# Patient Record
Sex: Female | Born: 1935
Health system: Southern US, Community
[De-identification: ages and names within clinical notes are randomized; demographics above are authoritative.]

## PROBLEM LIST (undated history)

## (undated) DIAGNOSIS — K219 Gastro-esophageal reflux disease without esophagitis: Secondary | ICD-10-CM

## (undated) DIAGNOSIS — R55 Syncope and collapse: Secondary | ICD-10-CM

## (undated) DIAGNOSIS — Z923 Personal history of irradiation: Secondary | ICD-10-CM

## (undated) DIAGNOSIS — E785 Hyperlipidemia, unspecified: Secondary | ICD-10-CM

## (undated) DIAGNOSIS — Z972 Presence of dental prosthetic device (complete) (partial): Secondary | ICD-10-CM

## (undated) DIAGNOSIS — K30 Functional dyspepsia: Secondary | ICD-10-CM

## (undated) DIAGNOSIS — E039 Hypothyroidism, unspecified: Secondary | ICD-10-CM

## (undated) DIAGNOSIS — G4730 Sleep apnea, unspecified: Secondary | ICD-10-CM

## (undated) DIAGNOSIS — Z973 Presence of spectacles and contact lenses: Secondary | ICD-10-CM

## (undated) DIAGNOSIS — C50919 Malignant neoplasm of unspecified site of unspecified female breast: Secondary | ICD-10-CM

## (undated) HISTORY — PX: DILATION AND CURETTAGE OF UTERUS: SHX78

## (undated) HISTORY — DX: Hyperlipidemia, unspecified: E78.5

## (undated) HISTORY — DX: Functional dyspepsia: K30

## (undated) HISTORY — DX: Gastro-esophageal reflux disease without esophagitis: K21.9

## (undated) HISTORY — PX: TUBAL LIGATION: SHX77

## (undated) HISTORY — DX: Sleep apnea, unspecified: G47.30

## (undated) HISTORY — PX: CARPAL TUNNEL RELEASE: SHX101

## (undated) HISTORY — DX: Malignant neoplasm of unspecified site of unspecified female breast: C50.919

## (undated) HISTORY — DX: Syncope and collapse: R55

## (undated) HISTORY — DX: Hypothyroidism, unspecified: E03.9

## (undated) HISTORY — PX: COLONOSCOPY: SHX174

---

## 1997-08-24 ENCOUNTER — Ambulatory Visit (HOSPITAL_COMMUNITY): Admission: RE | Admit: 1997-08-24 | Discharge: 1997-08-24 | Payer: Self-pay

## 1997-08-28 ENCOUNTER — Other Ambulatory Visit: Admission: RE | Admit: 1997-08-28 | Discharge: 1997-08-28 | Payer: Self-pay | Admitting: Cardiology

## 1998-09-03 ENCOUNTER — Other Ambulatory Visit: Admission: RE | Admit: 1998-09-03 | Discharge: 1998-09-03 | Payer: Self-pay | Admitting: Cardiology

## 1999-09-03 ENCOUNTER — Other Ambulatory Visit: Admission: RE | Admit: 1999-09-03 | Discharge: 1999-09-03 | Payer: Self-pay | Admitting: Internal Medicine

## 2004-01-28 DIAGNOSIS — R55 Syncope and collapse: Secondary | ICD-10-CM

## 2004-01-28 HISTORY — DX: Syncope and collapse: R55

## 2004-12-12 HISTORY — PX: US ECHOCARDIOGRAPHY: HXRAD669

## 2004-12-17 HISTORY — PX: CARDIOVASCULAR STRESS TEST: SHX262

## 2008-11-16 DIAGNOSIS — R7301 Impaired fasting glucose: Secondary | ICD-10-CM | POA: Insufficient documentation

## 2008-11-16 DIAGNOSIS — G2581 Restless legs syndrome: Secondary | ICD-10-CM | POA: Insufficient documentation

## 2008-11-16 DIAGNOSIS — E039 Hypothyroidism, unspecified: Secondary | ICD-10-CM | POA: Insufficient documentation

## 2008-11-16 DIAGNOSIS — F329 Major depressive disorder, single episode, unspecified: Secondary | ICD-10-CM | POA: Insufficient documentation

## 2008-11-16 DIAGNOSIS — K219 Gastro-esophageal reflux disease without esophagitis: Secondary | ICD-10-CM | POA: Insufficient documentation

## 2008-11-16 DIAGNOSIS — R350 Frequency of micturition: Secondary | ICD-10-CM | POA: Insufficient documentation

## 2008-12-11 DIAGNOSIS — G5600 Carpal tunnel syndrome, unspecified upper limb: Secondary | ICD-10-CM | POA: Insufficient documentation

## 2009-05-14 DIAGNOSIS — R55 Syncope and collapse: Secondary | ICD-10-CM | POA: Insufficient documentation

## 2009-11-20 DIAGNOSIS — Z2839 Other underimmunization status: Secondary | ICD-10-CM | POA: Insufficient documentation

## 2009-11-28 ENCOUNTER — Ambulatory Visit: Payer: Self-pay | Admitting: Cardiovascular Disease

## 2010-08-21 DIAGNOSIS — E162 Hypoglycemia, unspecified: Secondary | ICD-10-CM | POA: Insufficient documentation

## 2010-11-14 ENCOUNTER — Encounter: Payer: Self-pay | Admitting: Cardiovascular Disease

## 2010-11-21 ENCOUNTER — Ambulatory Visit (INDEPENDENT_AMBULATORY_CARE_PROVIDER_SITE_OTHER): Payer: Medicare Other | Admitting: Cardiovascular Disease

## 2010-11-21 ENCOUNTER — Encounter: Payer: Self-pay | Admitting: Cardiovascular Disease

## 2010-11-21 DIAGNOSIS — E7800 Pure hypercholesterolemia, unspecified: Secondary | ICD-10-CM | POA: Insufficient documentation

## 2010-11-21 DIAGNOSIS — R42 Dizziness and giddiness: Secondary | ICD-10-CM

## 2010-11-21 NOTE — Assessment & Plan Note (Signed)
She's not had her cholesterol levels drawn a while. She's not fasting today. She missed her recent appointment with Dr. Waynard Edwards.  I've encouraged her to get back on a good diet and exercise program. I'll see her again in 6 months. Should bring her lipid levels from Dr. Laurey Morale office.

## 2010-11-21 NOTE — Progress Notes (Signed)
[name] Date of Birth  1935/08/11 [name] [address]    [address] [place], [place]  [address] [phone]  Fax  [phone]  History of Present Illness:  75 yo with a hx of presyncope, hyperlipidemia, sleep apnea.  She has been under a lot of stress recently. Her sister was diagnosed with stage III inflammatory breast cancer. She has been going to [facility] a regular basis to help her sister with chemotherapy and other related issues.  She denies any chest pain, shortness breath, syncope, or presyncope.  She has a history of hypercholesterolemia. Her cholesterol levels are followed by Dr. Waynard Edwards.  She brought her blood pressure log with her today. All of her readings are acceptable.  Current Outpatient Prescriptions on File Prior to Visit  Medication Sig Dispense Refill  . aspirin 81 MG tablet Take 81 mg by mouth daily.        . Calcium Carbonate-Vitamin D (CALTRATE 600+D PO) Take by mouth daily.        . cholecalciferol (VITAMIN D) 400 UNITS TABS Take 400 Units by mouth daily.        . Coenzyme Q10 (COQ10) 100 MG CAPS Take by mouth daily.        Marland Kitchen doxepin (SINEQUAN) 50 MG capsule Take 50 mg by mouth.        . levothyroxine (SYNTHROID, LEVOTHROID) 125 MCG tablet Take 125 mcg by mouth daily.        . Magnesium 200 MG TABS Take 400 mg by mouth daily.       . Multiple Vitamin (MULTIVITAMIN) tablet Take 1 tablet by mouth daily.        Marland Kitchen omeprazole (PRILOSEC) 20 MG capsule Take 20 mg by mouth 4 (four) times a week.        . simvastatin (ZOCOR) 40 MG tablet Take 40 mg by mouth at bedtime.          Allergies  Allergen Reactions  . Shellfish-Derived Products     Past Medical History  Diagnosis Date  . Syncope and collapse   . Hypothyroidism   . Hyperlipidemia   . Sleep apnea   . GERD (gastroesophageal reflux disease)   . Indigestion     Past Surgical History  Procedure Date  . Tubal ligation   . Carpal tunnel release     LEFT HAND  . US  echocardiography 12/12/2004    EF 55-60%  . Cardiovascular stress test 12/17/2004    EF 73%. NO EVIDENCE OF ISCHEMIA    History  Smoking status  . Former Smoker  . Quit date: 01/27/1970  Smokeless tobacco  . Not on file    History  Alcohol Use No    Family History  Problem Relation Age of Onset  . Coronary artery disease Father   . Heart attack Father     Reviw of Systems:  Reviewed in the HPI.  All other systems are negative.  Physical Exam: BP 130/88  Pulse 80  Ht 5\' 3"  (1.6 m)  Wt 189 lb (85.73 kg)  BMI 33.48 kg/m2 The patient is alert and oriented x 3.  The mood and affect are normal.   Skin: warm and dry.  Color is normal.    HEENT:   Normal carotids, no JVD,   Lungs: clear   Heart: RR, no murmurs    Abdomen: + BS, nontender  Extremities:  No edema  Neuro:  nonfocal.     ECG: Normal sinus rhythm. She has  nonspecific ST and T wave changes.  There are no changes from her previous EKG. Assessment / Plan:

## 2010-11-21 NOTE — Assessment & Plan Note (Signed)
She's not had any recurrent episodes of presyncope.

## 2010-11-21 NOTE — Patient Instructions (Signed)
Your physician recommends that you continue on your current medications as directed. Please refer to the Current Medication list given to you today.  Your physician wants you to follow-up in: 6 months. You will receive a reminder letter in the mail two months in advance. If you don't receive a letter, please call our office to schedule the follow-up appointment.  

## 2011-01-30 ENCOUNTER — Encounter: Payer: Self-pay | Admitting: Cardiovascular Disease

## 2011-10-30 ENCOUNTER — Ambulatory Visit: Payer: Medicare Other | Admitting: Cardiovascular Disease

## 2011-12-02 ENCOUNTER — Ambulatory Visit: Payer: Medicare Other | Admitting: Cardiovascular Disease

## 2011-12-18 ENCOUNTER — Encounter: Payer: Self-pay | Admitting: Cardiovascular Disease

## 2011-12-18 ENCOUNTER — Ambulatory Visit (INDEPENDENT_AMBULATORY_CARE_PROVIDER_SITE_OTHER): Payer: Medicare Other | Admitting: Cardiovascular Disease

## 2011-12-18 VITALS — BP 146/86 | HR 82 | Ht 63.0 in | Wt 195.0 lb

## 2011-12-18 DIAGNOSIS — E7800 Pure hypercholesterolemia, unspecified: Secondary | ICD-10-CM

## 2011-12-18 MED ORDER — SIMVASTATIN 20 MG PO TABS: 20.0000 mg | ORAL_TABLET | Freq: Every day | ORAL | Status: DC

## 2011-12-18 NOTE — Progress Notes (Signed)
[name] Date of Birth  August 04, 1935 [facility] [address]    [address] [place], [place]  [address] [phone]  Fax  [phone]  History of Present Illness:  76 yo with a hx of presyncope, hyperlipidemia, sleep apnea.  She has been under a lot of stress recently. Her sister was diagnosed with stage III inflammatory breast cancer. She has been going to [facility] a regular basis to help her sister with chemotherapy and other related issues.  She denies any chest pain, shortness breath, syncope, or presyncope.  She has a history of hypercholesterolemia. Her cholesterol levels are followed by Dr. Waynard Edwards.  She brought her blood pressure log with her today. All of her readings are acceptable.  She brought labs were drawn at Dr. Mauricio Po his office. All of her numbers are quite good. Her a protein B. level is 67 which is quite good. Her total cholesterol is 150. The triglyceride level is 129. The HDL is 42. Her LDL is 82.  She's not had any episodes of chest pain or shortness breath.  Current Outpatient Prescriptions on File Prior to Visit  Medication Sig Dispense Refill  . aspirin 81 MG tablet Take 81 mg by mouth daily.        . Calcium Carbonate-Vitamin D (CALTRATE 600+D PO) Take by mouth daily.        . cholecalciferol (VITAMIN D) 400 UNITS TABS Take 400 Units by mouth daily.        Marland Kitchen doxepin (SINEQUAN) 50 MG capsule Take 50 mg by mouth.        . Magnesium 200 MG TABS Take 400 mg by mouth daily.       . Multiple Vitamin (MULTIVITAMIN) tablet Take 1 tablet by mouth daily.        Marland Kitchen omeprazole (PRILOSEC) 20 MG capsule Take 20 mg by mouth 4 (four) times a week.        . simvastatin (ZOCOR) 40 MG tablet Take 40 mg by mouth at bedtime.        . temazepam (RESTORIL) 15 MG capsule Take 15 mg by mouth at bedtime as needed.        . triamcinolone (NASACORT) 55 MCG/ACT nasal inhaler Place 2 sprays into the nose daily. As needed         Allergies  Allergen Reactions  .  Shellfish-Derived Products     Past Medical History  Diagnosis Date  . Syncope and collapse   . Hypothyroidism   . Hyperlipidemia   . Sleep apnea   . GERD (gastroesophageal reflux disease)   . Indigestion     Past Surgical History  Procedure Date  . Tubal ligation   . Carpal tunnel release     LEFT HAND  . US echocardiography 12/12/2004    EF 55-60%  . Cardiovascular stress test 12/17/2004    EF 73%. NO EVIDENCE OF ISCHEMIA    History  Smoking status  . Former Smoker  . Quit date: 01/27/1970  Smokeless tobacco  . Not on file    History  Alcohol Use No    Family History  Problem Relation Age of Onset  . Coronary artery disease Father   . Heart attack Father     Reviw of Systems:  Reviewed in the HPI.  All other systems are negative.  Physical Exam: BP 146/86  Pulse 82  Ht 5\' 3"  (1.6 m)  Wt 195 lb (88.451 kg)  BMI 34.54 kg/m2  SpO2 97% The patient is  alert and oriented x 3.  The mood and affect are normal.   Skin: warm and dry.  Color is normal.    HEENT:   Normal carotids, no JVD,   Lungs: clear   Heart: RR, no murmurs    Abdomen: + BS, nontender  Extremities:  No edema  Neuro:  nonfocal.     ECG: Nov. 21, 2013 - Normal sinus rhythm at 82 . She has nonspecific ST and T wave changes.  There are no changes from her previous EKG.  Assessment / Plan:

## 2011-12-18 NOTE — Patient Instructions (Addendum)
Your physician wants you to follow-up in: 1 year  You will receive a reminder letter in the mail two months in advance. If you don't receive a letter, please call our office to schedule the follow-up appointment.  Your physician recommends that you return for a FASTING lipid profile: 3 months need lipomed/ nmr with hfp  Your physician has recommended you make the following change in your medication:   Decrease simvastatin to 20 mg daily.

## 2011-12-18 NOTE — Assessment & Plan Note (Signed)
Sydney Wood is doing well. Her most recent cholesterol numbers are quite good. She would like to decrease her simvastatin dose to 20 mg a day. We'll followup the Lipomed  profile in 3 months as well as a hepatic profile.  I will see her again in 1 year for ov and fasting labs.

## 2012-02-09 ENCOUNTER — Encounter: Payer: Self-pay | Admitting: Cardiovascular Disease

## 2012-03-17 ENCOUNTER — Encounter: Payer: Self-pay | Admitting: Cardiovascular Disease

## 2012-03-17 ENCOUNTER — Other Ambulatory Visit (INDEPENDENT_AMBULATORY_CARE_PROVIDER_SITE_OTHER): Payer: Medicare Other

## 2012-03-17 DIAGNOSIS — R0989 Other specified symptoms and signs involving the circulatory and respiratory systems: Secondary | ICD-10-CM

## 2012-03-17 DIAGNOSIS — E7800 Pure hypercholesterolemia, unspecified: Secondary | ICD-10-CM

## 2012-03-22 ENCOUNTER — Encounter: Payer: Self-pay | Admitting: Cardiovascular Disease

## 2012-03-24 ENCOUNTER — Telehealth: Payer: Self-pay | Admitting: *Deleted

## 2012-03-24 DIAGNOSIS — E785 Hyperlipidemia, unspecified: Secondary | ICD-10-CM

## 2012-03-24 MED ORDER — SIMVASTATIN 40 MG PO TABS: 40.0000 mg | ORAL_TABLET | Freq: Every day | ORAL | Status: DC

## 2012-03-24 NOTE — Telephone Encounter (Signed)
Reviewed lipomed profile, per dr Elease Hashimoto pt to increase simvastatin to 40 mg and have lab draw in 6 months, pt agreed to plan and lab date was given.

## 2012-04-22 ENCOUNTER — Encounter: Payer: Self-pay | Admitting: Cardiovascular Disease

## 2012-04-29 DIAGNOSIS — Z91040 Latex allergy status: Secondary | ICD-10-CM | POA: Insufficient documentation

## 2012-05-10 ENCOUNTER — Emergency Department (HOSPITAL_COMMUNITY)
Admission: EM | Admit: 2012-05-10 | Discharge: 2012-05-10 | Disposition: A | Discharge status: Home / Self Care | Payer: Medicare Other | Attending: Emergency Medicine | Admitting: Emergency Medicine

## 2012-05-10 ENCOUNTER — Encounter (HOSPITAL_COMMUNITY): Payer: Self-pay | Admitting: Emergency Medicine

## 2012-05-10 DIAGNOSIS — K219 Gastro-esophageal reflux disease without esophagitis: Secondary | ICD-10-CM | POA: Insufficient documentation

## 2012-05-10 DIAGNOSIS — E039 Hypothyroidism, unspecified: Secondary | ICD-10-CM | POA: Insufficient documentation

## 2012-05-10 DIAGNOSIS — E785 Hyperlipidemia, unspecified: Secondary | ICD-10-CM | POA: Insufficient documentation

## 2012-05-10 DIAGNOSIS — Z79899 Other long term (current) drug therapy: Secondary | ICD-10-CM | POA: Insufficient documentation

## 2012-05-10 DIAGNOSIS — R040 Epistaxis: Secondary | ICD-10-CM | POA: Insufficient documentation

## 2012-05-10 DIAGNOSIS — Z87891 Personal history of nicotine dependence: Secondary | ICD-10-CM | POA: Insufficient documentation

## 2012-05-10 DIAGNOSIS — Z8719 Personal history of other diseases of the digestive system: Secondary | ICD-10-CM | POA: Insufficient documentation

## 2012-05-10 MED ORDER — OXYMETAZOLINE HCL 0.05 % NA SOLN
1.0000 | Freq: Once | NASAL | Status: AC
  Administered 2012-05-10: 1 via NASAL
  Filled 2012-05-10 (×2): qty 15

## 2012-05-10 NOTE — ED Notes (Signed)
Patient began having nosebleeds in February.  Patient was sent by urgent care because they couldn't get the bleeding to stop.

## 2012-05-10 NOTE — ED Provider Notes (Signed)
History    This chart was scribed for non-physician practitioner working with Sydney Wood. Ghim, MD by ED Scribe, Burman Nieves. This patient was seen in room WTR9/WTR9 and the patient's care was started at 7:24 PM.   CSN: [id number]  Arrival date & time 05/10/12  6213   First MD Initiated Contact with Patient 05/10/12 1924      Chief Complaint  Patient presents with  . Epistaxis    (Consider location/radiation/quality/duration/timing/severity/associated sxs/prior treatment) Patient is a 77 y.o. female presenting with nosebleeds. The history is provided by the patient. No language interpreter was used.  Epistaxis  This is a new problem. The current episode started 3 to 5 hours ago. The problem occurs constantly. The bleeding has been from the left nare.   Sydney Wood is a 77 y.o. female who presents to the Emergency Department complaining of moderate intermittent epistaxis onset 4 PM today and has been going on since February 2014. Pt was sent to ED from Urgent Care because they could not get the bleeding to stop. Urgent Care attempted to cauterize pt's left nare which did not stop the bleeding. Pt's nose has stopped bleeding as of now in the ED. Pt denies fever, chills, cough, nausea, vomiting, diarrhea, SOB, weakness, and any other associated symptoms. Pt denies taking any new blood thinner medications. Pt's current PCP is Dr. Waynard Edwards.   Past Medical History  Diagnosis Date  . Syncope and collapse   . Hypothyroidism   . Hyperlipidemia   . Sleep apnea   . GERD (gastroesophageal reflux disease)   . Indigestion     Past Surgical History  Procedure Laterality Date  . Tubal ligation    . Carpal tunnel release      LEFT HAND  . US echocardiography  12/12/2004    EF 55-60%  . Cardiovascular stress test  12/17/2004    EF 73%. NO EVIDENCE OF ISCHEMIA    Family History  Problem Relation Age of Onset  . Coronary artery disease Father   . Heart attack Father     History   Substance Use Topics  . Smoking status: Former Smoker    Quit date: 01/27/1970  . Smokeless tobacco: Not on file  . Alcohol Use: No    OB History   Grav Para Term Preterm Abortions TAB SAB Ect Mult Living                  Review of Systems  HENT: Positive for nosebleeds.   All other systems reviewed and are negative.    Allergies  Latex and Shellfish-derived products  Home Medications   Current Outpatient Rx  Name  Route  Sig  Dispense  Refill  . Calcium Carbonate-Vitamin D (CALTRATE 600+D PO)   Oral   Take by mouth daily.           . cholecalciferol (VITAMIN D) 400 UNITS TABS   Oral   Take 400 Units by mouth daily.           Marland Kitchen doxepin (SINEQUAN) 50 MG capsule   Oral   Take 50 mg by mouth daily.          . Levothyroxine Sodium (SYNTHROID PO)   Oral   Take 125 mcg by mouth daily.         . Magnesium 200 MG TABS   Oral   Take 400 mg by mouth daily.          . Multiple Vitamin (MULTIVITAMIN) tablet  Oral   Take 1 tablet by mouth daily.           Marland Kitchen omeprazole (PRILOSEC) 20 MG capsule   Oral   Take 20 mg by mouth daily.          . simvastatin (ZOCOR) 40 MG tablet   Oral   Take 1 tablet (40 mg total) by mouth at bedtime.   30 tablet   11     Increased dose     BP 145/97  Pulse 104  Temp(Src) 98.9 F (37.2 C) (Oral)  Resp 16  SpO2 92%  Physical Exam  Nursing note and vitals reviewed. Constitutional: She is oriented to person, place, and time. She appears well-developed and well-nourished. No distress.  HENT:  Head: Normocephalic and atraumatic.  Blood in left nostril. Area of nasal septal of the left nostril that was carterized by urgent care.  Eyes: EOM are normal.  Neck: Neck supple. No tracheal deviation present.  Cardiovascular: Normal rate.   Pulmonary/Chest: Effort normal. No respiratory distress.  Musculoskeletal: Normal range of motion.  Neurological: She is alert and oriented to person, place, and time.  Skin: Skin  is warm and dry.  Psychiatric: She has a normal mood and affect. Her behavior is normal.    ED Course  Procedures (including critical care time) DIAGNOSTIC STUDIES: Oxygen Saturation is 92% on room air, normal by my interpretation.    COORDINATION OF CARE: 7:36 PM Discussed ED treatment with pt and pt agrees.     Labs Reviewed - No data to display No results found.   1. Anterior epistaxis       MDM  Pt with nose bleed. Not anticoagulated. Uses c pap at night which dries up her nasal passages and has had multiple nose bleeds recently. Today unable to stop at home or at [facility]. Nasal left sided tried to be cauterized at [facility] but still bleeding. I have applied afrin 2 sprays into left nostril after evacuating all clots. Firm pressure for 5 min. Bleeding resolved.  Pt monitored in ED for about 30 min with no bleeding. Pt does not want packing, wants to try to go home with follow up with PCP in [place].    I personally performed the services described in this documentation, which was scribed in my presence. The recorded information has been reviewed and is accurate.         Lottie Mussel, PA-C 05/10/12 2354

## 2012-05-11 NOTE — ED Provider Notes (Signed)
Medical screening examination/treatment/procedure(s) were performed by non-physician practitioner and as supervising physician I was immediately available for consultation/collaboration.  Gavin Pound. Oletta Lamas, MD 05/11/12 [phone]

## 2012-08-16 ENCOUNTER — Encounter: Payer: Self-pay | Admitting: Gastroenterology

## 2012-08-20 ENCOUNTER — Encounter: Payer: Self-pay | Admitting: Gastroenterology

## 2012-09-22 ENCOUNTER — Other Ambulatory Visit: Payer: Self-pay | Admitting: Cardiovascular Disease

## 2012-09-22 ENCOUNTER — Other Ambulatory Visit (INDEPENDENT_AMBULATORY_CARE_PROVIDER_SITE_OTHER): Payer: Medicare Other

## 2012-09-22 DIAGNOSIS — E785 Hyperlipidemia, unspecified: Secondary | ICD-10-CM

## 2012-09-23 LAB — NMR LIPOPROFILE WITH LIPIDS
Cholesterol, Total: 132 mg/dL (ref <200)
HDL Particle Number: 35.7 umol/L (ref >=30.5)
HDL-C: 47 mg/dL (ref >=40)
LDL (calc): 65 mg/dL (ref <100)
LDL Size: 20.3 nm — ABNORMAL LOW (ref >20.5)
LP-IR Score: 70 — ABNORMAL HIGH (ref <=45)

## 2012-09-23 LAB — HEPATIC FUNCTION PANEL
Bilirubin, Direct: 0.1 mg/dL (ref 0.0–0.3)
Total Bilirubin: 0.5 mg/dL (ref 0.3–1.2)
Total Protein: 7.6 g/dL (ref 6.0–8.3)

## 2012-10-13 ENCOUNTER — Ambulatory Visit (AMBULATORY_SURGERY_CENTER): Payer: Self-pay | Admitting: *Deleted

## 2012-10-13 VITALS — Ht 64.0 in | Wt 195.0 lb

## 2012-10-13 DIAGNOSIS — Z1211 Encounter for screening for malignant neoplasm of colon: Secondary | ICD-10-CM

## 2012-10-13 MED ORDER — NA SULFATE-K SULFATE-MG SULF 17.5-3.13-1.6 GM/177ML PO SOLN
ORAL | Status: DC
Start: 2012-10-13 — End: 2012-10-27

## 2012-10-13 NOTE — Progress Notes (Signed)
Patient denies any allergies to eggs or soy. Patient denies any problems with anesthesia.  

## 2012-10-15 ENCOUNTER — Encounter: Payer: Self-pay | Admitting: Gastroenterology

## 2012-10-27 ENCOUNTER — Ambulatory Visit (AMBULATORY_SURGERY_CENTER): Payer: Medicare Other | Admitting: Gastroenterology

## 2012-10-27 ENCOUNTER — Encounter: Payer: Self-pay | Admitting: Gastroenterology

## 2012-10-27 VITALS — BP 132/70 | HR 60 | Temp 98.2°F | Resp 19 | Ht 64.0 in | Wt 195.0 lb

## 2012-10-27 DIAGNOSIS — Z1211 Encounter for screening for malignant neoplasm of colon: Secondary | ICD-10-CM

## 2012-10-27 DIAGNOSIS — K5730 Diverticulosis of large intestine without perforation or abscess without bleeding: Secondary | ICD-10-CM

## 2012-10-27 MED ORDER — SODIUM CHLORIDE 0.9 % IV SOLN: 500.0000 mL | INTRAVENOUS | Status: DC

## 2012-10-27 NOTE — Progress Notes (Signed)
Patient did not experience any of the following events: a burn prior to discharge; a fall within the facility; wrong site/side/patient/procedure/implant event; or a hospital transfer or hospital admission upon discharge from the facility. (G8907) Patient did not have preoperative order for IV antibiotic SSI prophylaxis. (G8918)  

## 2012-10-27 NOTE — Patient Instructions (Addendum)
YOU HAD AN ENDOSCOPIC PROCEDURE TODAY AT THE [facility]: Refer to the procedure report that was given to you for any specific questions about what was found during the examination.  If the procedure report does not answer your questions, please call your gastroenterologist to clarify.  If you requested that your care partner not be given the details of your procedure findings, then the procedure report has been included in a sealed envelope for you to review at your convenience later.  YOU SHOULD EXPECT: Some feelings of bloating in the abdomen. Passage of more gas than usual.  Walking can help get rid of the air that was put into your GI tract during the procedure and reduce the bloating. If you had a lower endoscopy (such as a colonoscopy or flexible sigmoidoscopy) you may notice spotting of blood in your stool or on the toilet paper. If you underwent a bowel prep for your procedure, then you may not have a normal bowel movement for a few days.  DIET: Your first meal following the procedure should be a light meal and then it is ok to progress to your normal diet.  A half-sandwich or bowl of soup is an example of a good first meal.  Heavy or fried foods are harder to digest and may make you feel nauseous or bloated.  Likewise meals heavy in dairy and vegetables can cause extra gas to form and this can also increase the bloating.  Drink plenty of fluids but you should avoid alcoholic beverages for 24 hours.  ACTIVITY: Your care partner should take you home directly after the procedure.  You should plan to take it easy, moving slowly for the rest of the day.  You can resume normal activity the day after the procedure however you should NOT DRIVE or use heavy machinery for 24 hours (because of the sedation medicines used during the test).    SYMPTOMS TO REPORT IMMEDIATELY: A gastroenterologist can be reached at any hour.  During normal business hours, 8:30 AM to 5:00 PM Monday through Friday,  call [phone].  After hours and on weekends, please call the GI answering service at [phone] who will take a message and have the physician on call contact you.   Following lower endoscopy (colonoscopy or flexible sigmoidoscopy):  Excessive amounts of blood in the stool  Significant tenderness or worsening of abdominal pains  Swelling of the abdomen that is new, acute  Fever of 100F or higher  FOLLOW UP: Our staff will call the home number listed on your records the next business day following your procedure to check on you and address any questions or concerns that you may have at that time regarding the information given to you following your procedure. This is a courtesy call and so if there is no answer at the home number and we have not heard from you through the emergency physician on call, we will assume that you have returned to your regular daily activities without incident.  SIGNATURES/CONFIDENTIALITY: You and/or your care partner have signed paperwork which will be entered into your electronic medical record.  These signatures attest to the fact that that the information above on your After Visit Summary has been reviewed and is understood.  Full responsibility of the confidentiality of this discharge information lies with you and/or your care-partner.  Please read over information about diverticulosis and high fiber diets  Continue your normal medications  No further need for a colonoscopy unless you need one

## 2012-10-27 NOTE — Progress Notes (Signed)
Lidocaine-40mg IV prior to Propofol InductionPropofol given over incremental dosages 

## 2012-10-27 NOTE — Op Note (Signed)
[facility] [address] [place], [address]   COLONOSCOPY PROCEDURE REPORT  PATIENT: Sydney Wood, Sydney Wood  MR#: [id number] BIRTHDATE: [birth year] , 77  yrs. old GENDER: Female ENDOSCOPIST: Louis Meckel, MD REFERRED XB:JYNW Perini, M.D. PROCEDURE DATE:  10/27/2012 PROCEDURE:   Colonoscopy, diagnostic First Screening Colonoscopy - Avg.  risk and is 50 yrs.  old or older - No.  Prior Negative Screening - Now for repeat screening. 10 or more years since last screening  History of Adenoma - Now for follow-up colonoscopy & has been > or = to 3 yrs.  N/A  Polyps Removed Today? No.  Recommend repeat exam, <10 yrs? No. ASA CLASS:   Class II INDICATIONS:Average risk patient for colon cancer. MEDICATIONS: propofol (Diprivan) 200mg  IV  DESCRIPTION OF PROCEDURE:   After the risks benefits and alternatives of the procedure were thoroughly explained, informed consent was obtained.  A digital rectal exam revealed no abnormalities of the rectum.   The LB [id number]  endoscope was introduced through the anus and advanced to the cecum, which was identified by both the appendix and ileocecal valve. No adverse events experienced.   The quality of the prep was Suprep good  The instrument was then slowly withdrawn as the colon was fully examined.      COLON FINDINGS: There was moderate diverticulosis noted in the sigmoid colon with associated muscular hypertrophy.   The colon mucosa was otherwise normal.  Retroflexed views revealed no abnormalities. The time to cecum=6 minutes 25 seconds.  Withdrawal time=10 minutes 30 seconds.  The scope was withdrawn and the procedure completed. COMPLICATIONS: There were no complications.  ENDOSCOPIC IMPRESSION: 1.   There was moderate diverticulosis noted in the sigmoid colon 2.   The colon mucosa was otherwise normal  RECOMMENDATIONS: Given your age, you will not need another colonoscopy for colon cancer screening or polyp  surveillance.  These types of tests usually stop around the age 16.   eSigned:  Louis Meckel, MD 10/27/2012 9:39 AM   cc:   PATIENT NAME:  Sydney Wood, Sydney Wood MR#: [id number]

## 2012-10-28 ENCOUNTER — Telehealth: Payer: Self-pay | Admitting: *Deleted

## 2012-10-28 NOTE — Telephone Encounter (Signed)
  Follow up Call-  Call back number 10/27/2012  Post procedure Call Back phone  # [phone]  Permission to leave phone message Yes     Patient questions:  Do you have a fever, pain , or abdominal swelling? no Pain Score  0 *  Have you tolerated food without any problems? yes  Have you been able to return to your normal activities? yes  Do you have any questions about your discharge instructions: Diet   no Medications  no Follow up visit  no  Do you have questions or concerns about your Care? no  Actions: * If pain score is 4 or above: No action needed, pain <4.

## 2012-12-02 ENCOUNTER — Encounter: Payer: Self-pay | Admitting: Cardiovascular Disease

## 2012-12-13 ENCOUNTER — Encounter: Payer: Self-pay | Admitting: Cardiovascular Disease

## 2012-12-13 ENCOUNTER — Ambulatory Visit (INDEPENDENT_AMBULATORY_CARE_PROVIDER_SITE_OTHER): Payer: Medicare Other | Admitting: Cardiovascular Disease

## 2012-12-13 VITALS — BP 146/94 | HR 87 | Ht 64.0 in | Wt 199.0 lb

## 2012-12-13 DIAGNOSIS — I10 Essential (primary) hypertension: Secondary | ICD-10-CM

## 2012-12-13 DIAGNOSIS — E7800 Pure hypercholesterolemia, unspecified: Secondary | ICD-10-CM

## 2012-12-13 NOTE — Patient Instructions (Signed)
Your physician wants you to follow-up in: 1 year  You will receive a reminder letter in the mail two months in advance. If you don't receive a letter, please call our office to schedule the follow-up appointment.   Your physician recommends that you return for a FASTING lipid profile: 1 year / NMR  Your physician recommends that you continue on your current medications as directed. Please refer to the Current Medication list given to you today.

## 2012-12-13 NOTE — Progress Notes (Signed)
Sydney Wood Date of Birth  [birth year] [facility] [address]    [address] [place], [place]  [address] [phone]  Fax  [phone]  Problem List 1. Hypertension 2. Hyperlipidemia 3/. Sleep apnea    History of Present Illness:  77 yo with a hx of presyncope, hyperlipidemia, sleep apnea.  She has been under a lot of stress recently. Her sister was diagnosed with stage III inflammatory breast cancer. She has been going to [facility] a regular basis to help her sister with chemotherapy and other related issues.  She denies any chest pain, shortness breath, syncope, or presyncope.  She has a history of hypercholesterolemia. Her cholesterol levels are followed by Dr. Waynard Edwards.  She brought her blood pressure log with her today. All of her readings are acceptable.  She brought labs were drawn at Dr. Mauricio Po his office. All of her numbers are quite good. Her a protein B. level is 67 which is quite good. Her total cholesterol is 150. The triglyceride level is 129. The HDL is 42. Her LDL is 82.  She's not had any episodes of chest pain or shortness breath.  Nov. 17, 2014: Feeling well.  BP at home is usually normal.  Still traveling lots - helping her sister with breast cancer.  Exercising a bit. tries to walk in the neighborhood.    Current Outpatient Prescriptions on File Prior to Visit  Medication Sig Dispense Refill  . aspirin 81 MG tablet Take 81 mg by mouth daily.      . B Complex-C (B COMPLEX-VITAMIN C PO) Take 1 packet by mouth daily.      . Calcium Carbonate-Vitamin D (CALTRATE 600+D PO) Take by mouth daily.        . cholecalciferol (VITAMIN D) 400 UNITS TABS Take 400 Units by mouth daily.        Marland Kitchen doxepin (SINEQUAN) 50 MG capsule Take 50 mg by mouth daily.       . Levothyroxine Sodium (SYNTHROID PO) Take 125 mcg by mouth daily.      . Magnesium 200 MG TABS Take 400 mg by mouth daily.       . Multiple Vitamin (MULTIVITAMIN) tablet Take 1 tablet by mouth daily.         Marland Kitchen omeprazole (PRILOSEC) 20 MG capsule Take 20 mg by mouth daily.       . simvastatin (ZOCOR) 40 MG tablet Take 1 tablet (40 mg total) by mouth at bedtime.  30 tablet  11  . temazepam (RESTORIL) 15 MG capsule Take 15 mg by mouth at bedtime as needed for sleep.       No current facility-administered medications on file prior to visit.    Allergies  Allergen Reactions  . Latex Hives and Swelling  . Shellfish-Derived Products Nausea And Vomiting    Past Medical History  Diagnosis Date  . Syncope and collapse   . Hypothyroidism   . Hyperlipidemia   . Sleep apnea     CPAP  . GERD (gastroesophageal reflux disease)   . Indigestion     Past Surgical History  Procedure Laterality Date  . Tubal ligation    . Carpal tunnel release      LEFT HAND  . US echocardiography  12/12/2004    EF 55-60%  . Cardiovascular stress test  12/17/2004    EF 73%. NO EVIDENCE OF ISCHEMIA    History  Smoking status  . Former Smoker  . Quit date: 01/27/1970  Smokeless tobacco  .  Never Used    History  Alcohol Use No    Family History  Problem Relation Age of Onset  . Coronary artery disease Father   . Heart attack Father   . Colon cancer Neg Hx   . Stomach cancer Neg Hx     Reviw of Systems:  Reviewed in the HPI.  All other systems are negative.  Physical Exam: BP 146/94  Pulse 87  Ht 5\' 4"  (1.626 m)  Wt 199 lb (90.266 kg)  BMI 34.14 kg/m2 The patient is alert and oriented x 3.  The mood and affect are normal.   Skin: warm and dry.  Color is normal.    HEENT:   Normal carotids, no JVD,   Lungs: clear   Heart: RR, no murmurs    Abdomen: + BS, nontender  Extremities:  No edema  Neuro:  nonfocal.     ECG: Nov. 17, 2014:  NSR at 66.  Normal ecg.   Assessment / Plan:

## 2012-12-13 NOTE — Assessment & Plan Note (Signed)
She is doing well.   Her LDL particle number is 1183 - down form 1300's .  Continue with current dose of simva.  I will see her again in 1 years.

## 2012-12-17 ENCOUNTER — Other Ambulatory Visit: Payer: Medicare Other

## 2012-12-20 ENCOUNTER — Ambulatory Visit: Payer: Medicare Other | Admitting: Cardiovascular Disease

## 2013-12-16 ENCOUNTER — Other Ambulatory Visit (INDEPENDENT_AMBULATORY_CARE_PROVIDER_SITE_OTHER): Payer: Medicare Other | Admitting: *Deleted

## 2013-12-16 DIAGNOSIS — E7800 Pure hypercholesterolemia, unspecified: Secondary | ICD-10-CM

## 2013-12-16 LAB — HEPATIC FUNCTION PANEL
ALBUMIN: 4.2 g/dL (ref 3.5–5.2)
ALT: 16 U/L (ref 0–35)
AST: 21 U/L (ref 0–37)
Alkaline Phosphatase: 84 U/L (ref 39–117)
Bilirubin, Direct: 0.1 mg/dL (ref 0.0–0.3)
Total Bilirubin: 0.6 mg/dL (ref 0.2–1.2)
Total Protein: 7.5 g/dL (ref 6.0–8.3)

## 2013-12-19 LAB — NMR LIPOPROFILE WITH LIPIDS
Cholesterol, Total: 156 mg/dL (ref 100–199)
HDL Particle Number: 31 umol/L (ref >=30.5)
HDL Size: 8.5 nm — ABNORMAL LOW (ref >=9.2)
HDL-C: 44 mg/dL (ref >39)
LARGE HDL: 2.2 umol/L — AB (ref >=4.8)
LDL CALC: 87 mg/dL (ref 0–99)
LDL Particle Number: 1269 nmol/L — ABNORMAL HIGH (ref <1000)
LDL SIZE: 20.6 nm (ref >=20.8)
LP-IR Score: 85 — ABNORMAL HIGH (ref <=45)
Large VLDL-P: 5.9 nmol/L — ABNORMAL HIGH (ref <=2.7)
SMALL LDL PARTICLE NUMBER: 545 nmol/L — AB (ref <=527)
Triglycerides: 123 mg/dL (ref 0–149)
VLDL Size: 57.8 nm — ABNORMAL HIGH (ref <=46.6)

## 2013-12-20 ENCOUNTER — Encounter: Payer: Self-pay | Admitting: Cardiovascular Disease

## 2013-12-20 ENCOUNTER — Ambulatory Visit (INDEPENDENT_AMBULATORY_CARE_PROVIDER_SITE_OTHER): Payer: Medicare Other | Admitting: Cardiovascular Disease

## 2013-12-20 VITALS — BP 150/86 | HR 89 | Ht 64.0 in | Wt 194.4 lb

## 2013-12-20 DIAGNOSIS — E785 Hyperlipidemia, unspecified: Secondary | ICD-10-CM

## 2013-12-20 DIAGNOSIS — E7800 Pure hypercholesterolemia, unspecified: Secondary | ICD-10-CM

## 2013-12-20 DIAGNOSIS — I10 Essential (primary) hypertension: Secondary | ICD-10-CM

## 2013-12-20 NOTE — Patient Instructions (Signed)
Your physician recommends that you continue on your current medications as directed. Please refer to the Current Medication list given to you today.  Your physician wants you to follow-up in: 1 year with Dr. Acie Fredrickson.  You will receive a reminder letter in the mail two months in advance. If you don't receive a letter, please call our office to schedule the follow-up appointment.  Your physician recommends that you return for lab work (lipomed profile, liver panel, bmet) in: 1 year on the day of or a few days before your office visit with Dr. Acie Fredrickson.  You will need to FAST for this appointment - nothing to eat or drink after midnight the night before except water.

## 2013-12-20 NOTE — Progress Notes (Signed)
Sydney Wood Date of Birth  1935-08-18 [facility] [address]    [address] [place], [place]  [address] [phone]  Fax  [phone]  Problem List 1. Hypertension 2. Hyperlipidemia 3/. Sleep apnea    History of Present Illness:  78 yo with a hx of presyncope, hyperlipidemia, sleep apnea.  She has been under a lot of stress recently. Her sister was diagnosed with stage III inflammatory breast cancer. She has been going to [facility] a regular basis to help her sister with chemotherapy and other related issues.  She denies any chest pain, shortness breath, syncope, or presyncope.  She has a history of hypercholesterolemia. Her cholesterol levels are followed by Dr. Joylene Draft.  She brought her blood pressure log with her today. All of her readings are acceptable.  She brought labs were drawn at Dr. Lindell Noe his office. All of her numbers are quite good. Her a protein B. level is 67 which is quite good. Her total cholesterol is 150. The triglyceride level is 129. The HDL is 42. Her LDL is 82.  She's not had any episodes of chest pain or shortness breath.  Nov. 17, 2014: Feeling well.  BP at home is usually normal.  Still traveling lots - helping her sister with breast cancer.  Exercising a bit. tries to walk in the neighborhood.   Nov. 24, 2015:  Sydney Wood is doing well.  Tries to exercise.  BP readings are ok No CP or dyspnea.    Current Outpatient Prescriptions on File Prior to Visit  Medication Sig Dispense Refill  . aspirin 81 MG tablet Take 81 mg by mouth daily.    . B Complex-C (B COMPLEX-VITAMIN C PO) Take 1 packet by mouth daily.    . Calcium Carbonate-Vitamin D (CALTRATE 600+D PO) Take by mouth daily.      . cholecalciferol (VITAMIN D) 400 UNITS TABS Take 400 Units by mouth daily.      . Levothyroxine Sodium (SYNTHROID PO) Take 150 mcg by mouth daily.     . Magnesium 200 MG TABS Take 400 mg by mouth daily.     . Multiple Vitamin (MULTIVITAMIN) tablet Take 1  tablet by mouth daily.      Marland Kitchen omeprazole (PRILOSEC) 20 MG capsule Take 20 mg by mouth daily.     . temazepam (RESTORIL) 15 MG capsule Take 15 mg by mouth at bedtime as needed for sleep.     No current facility-administered medications on file prior to visit.    Allergies  Allergen Reactions  . Latex Hives and Swelling  . Shellfish-Derived Products Nausea And Vomiting    Past Medical History  Diagnosis Date  . Syncope and collapse   . Hypothyroidism   . Hyperlipidemia   . Sleep apnea     CPAP  . GERD (gastroesophageal reflux disease)   . Indigestion     Past Surgical History  Procedure Laterality Date  . Tubal ligation    . Carpal tunnel release      LEFT HAND  . US echocardiography  12/12/2004    EF 55-60%  . Cardiovascular stress test  12/17/2004    EF 73%. NO EVIDENCE OF ISCHEMIA    History  Smoking status  . Former Smoker  . Quit date: 01/27/1970  Smokeless tobacco  . Never Used    History  Alcohol Use No    Family History  Problem Relation Age of Onset  . Coronary artery disease Father   . Heart attack Father   .  Colon cancer Neg Hx   . Stomach cancer Neg Hx     Reviw of Systems:  Reviewed in the HPI.  All other systems are negative.  Physical Exam: BP 150/86 mmHg  Pulse 89  Ht 5\' 4"  (1.626 m)  Wt 194 lb 6.4 oz (88.179 kg)  BMI 33.35 kg/m2 The patient is alert and oriented x 3.  The mood and affect are normal.   Skin: warm and dry.  Color is normal.    HEENT:   Normal carotids, no JVD,   Lungs: clear   Heart: RR, no murmurs    Abdomen: + BS, nontender  Extremities:  No edema  Neuro:  nonfocal.     ECG: Nov. 24, 2015:, NSR at 71.  No ST or T wave changes  Assessment / Plan:

## 2013-12-20 NOTE — Assessment & Plan Note (Signed)
Her blood pressure is a bit high in the office. She notes that it is always a little higher in the doctor's office. She'll continue to measure her blood pressure on a regular basis. She'll call if it stays elevated.

## 2013-12-20 NOTE — Assessment & Plan Note (Addendum)
She continues to have very mild hyperlipidemia. Her LDL particle number is 1269. She is on low-dose atorvastatin.  Our goal is < 1000.   She may tolerate a higher dose. She will discuss this later with Dr. Joylene Draft.  She did not tolerate Simvastatin but seems to be tolerating the Atorvastatin fairly well. Continue diet and exercise.

## 2014-04-07 ENCOUNTER — Telehealth: Payer: Self-pay | Admitting: *Deleted

## 2014-04-07 DIAGNOSIS — C50411 Malignant neoplasm of upper-outer quadrant of right female breast: Secondary | ICD-10-CM

## 2014-04-07 NOTE — Telephone Encounter (Signed)
Confirmed [facility] for 04/19/14 at 12N .  Instructions and contact information given.

## 2014-04-19 ENCOUNTER — Other Ambulatory Visit (HOSPITAL_BASED_OUTPATIENT_CLINIC_OR_DEPARTMENT_OTHER): Payer: Medicare Other

## 2014-04-19 ENCOUNTER — Other Ambulatory Visit: Payer: Self-pay | Admitting: General Surgery

## 2014-04-19 ENCOUNTER — Encounter (INDEPENDENT_AMBULATORY_CARE_PROVIDER_SITE_OTHER): Payer: Self-pay

## 2014-04-19 ENCOUNTER — Ambulatory Visit: Payer: Medicare Other | Admitting: Physical Therapy

## 2014-04-19 ENCOUNTER — Ambulatory Visit (HOSPITAL_BASED_OUTPATIENT_CLINIC_OR_DEPARTMENT_OTHER): Payer: Medicare Other

## 2014-04-19 ENCOUNTER — Encounter: Payer: Self-pay | Admitting: General Practice

## 2014-04-19 ENCOUNTER — Ambulatory Visit
Admission: RE | Admit: 2014-04-19 | Discharge: 2014-04-19 | Disposition: A | Discharge status: Home / Self Care | Payer: Medicare Other | Source: Ambulatory Visit | Attending: Radiation Oncology | Admitting: Radiation Oncology

## 2014-04-19 ENCOUNTER — Encounter: Payer: Self-pay | Admitting: Hematology and Oncology

## 2014-04-19 ENCOUNTER — Ambulatory Visit (HOSPITAL_BASED_OUTPATIENT_CLINIC_OR_DEPARTMENT_OTHER): Payer: Medicare Other | Admitting: Hematology and Oncology

## 2014-04-19 VITALS — BP 160/76 | HR 79 | Temp 97.3°F | Resp 18 | Ht 64.0 in | Wt 193.8 lb

## 2014-04-19 DIAGNOSIS — Z171 Estrogen receptor negative status [ER-]: Secondary | ICD-10-CM | POA: Diagnosis not present

## 2014-04-19 DIAGNOSIS — C50411 Malignant neoplasm of upper-outer quadrant of right female breast: Secondary | ICD-10-CM

## 2014-04-19 DIAGNOSIS — D0511 Intraductal carcinoma in situ of right breast: Secondary | ICD-10-CM

## 2014-04-19 DIAGNOSIS — D0510 Intraductal carcinoma in situ of unspecified breast: Secondary | ICD-10-CM | POA: Insufficient documentation

## 2014-04-19 LAB — COMPREHENSIVE METABOLIC PANEL (CC13)
ALBUMIN: 4.1 g/dL (ref 3.5–5.0)
ALK PHOS: 96 U/L (ref 40–150)
ALT: 17 U/L (ref 0–55)
ANION GAP: 10 meq/L (ref 3–11)
AST: 19 U/L (ref 5–34)
BUN: 15.2 mg/dL (ref 7.0–26.0)
CO2: 25 mEq/L (ref 22–29)
CREATININE: 0.8 mg/dL (ref 0.6–1.1)
Calcium: 9.5 mg/dL (ref 8.4–10.4)
Chloride: 105 mEq/L (ref 98–109)
EGFR: 67 mL/min/{1.73_m2} — ABNORMAL LOW (ref >90)
Glucose: 95 mg/dl (ref 70–140)
Potassium: 4.6 mEq/L (ref 3.5–5.1)
Sodium: 140 mEq/L (ref 136–145)
Total Bilirubin: 0.44 mg/dL (ref 0.20–1.20)
Total Protein: 7.5 g/dL (ref 6.4–8.3)

## 2014-04-19 LAB — CBC WITH DIFFERENTIAL/PLATELET
BASO%: 0.2 % (ref 0.0–2.0)
Basophils Absolute: 0 10*3/uL (ref 0.0–0.1)
EOS ABS: 0.1 10*3/uL (ref 0.0–0.5)
EOS%: 1.3 % (ref 0.0–7.0)
HEMATOCRIT: 39.5 % (ref 34.8–46.6)
HGB: 12.8 g/dL (ref 11.6–15.9)
LYMPH%: 18.9 % (ref 14.0–49.7)
MCH: 28.4 pg (ref 25.1–34.0)
MCHC: 32.4 g/dL (ref 31.5–36.0)
MCV: 87.6 fL (ref 79.5–101.0)
MONO#: 0.5 10*3/uL (ref 0.1–0.9)
MONO%: 8.5 % (ref 0.0–14.0)
NEUT%: 71.1 % (ref 38.4–76.8)
NEUTROS ABS: 4.1 10*3/uL (ref 1.5–6.5)
PLATELETS: 198 10*3/uL (ref 145–400)
RBC: 4.51 10*6/uL (ref 3.70–5.45)
RDW: 13.8 % (ref 11.2–14.5)
WBC: 5.8 10*3/uL (ref 3.9–10.3)
lymph#: 1.1 10*3/uL (ref 0.9–3.3)

## 2014-04-19 NOTE — Progress Notes (Signed)
Sydney Wood is a very pleasant 79 y.o. female from [place], [place] with newly diagnosed DCIS of the right breast.  Biopsy results revealed the tumor's hormone status as ER negative and PR negative.   She presents today with her husband to the [facility]) for treatment consideration and recommendations from the breast surgeon, radiation oncologist, and medical oncologist.     I briefly met with Sydney Wood and her husband during her [facility] visit today. We discussed the purpose of the Survivorship Clinic, which will include monitoring for recurrence, coordinating completion of age and gender-appropriate cancer screenings, promotion of overall wellness, as well as managing potential late/long-term side effects of anti-cancer treatments.    The treatment plan for Sydney Wood will likely include surgery and radiation therapy.  As of today, the intent of treatment for Sydney Wood is cure, therefore she will be eligible for the Survivorship Clinic upon her completion of treatment.  Her survivorship care plan (SCP) document will be drafted and updated throughout the course of her treatment trajectory. She will receive the SCP in an office visit with myself in the Survivorship Clinic once she has completed treatment.   Sydney Wood was encouraged to ask questions and all questions were answered to her satisfaction.  She was given my business card and encouraged to contact me with any concerns regarding survivorship.  I look forward to participating in her care.   Mike Craze, NP [facility] [phone]

## 2014-04-19 NOTE — Progress Notes (Signed)
[facility] Psychosocial Distress Screening Spiritual Care  Counseling Intern [facility] visited with Ms Brunsman and her husband at [facility] to introduce [facility] team/resoureces, and to review distress screen per protocol.  The patient scored a 1 on the Psychosocial Distress Thermometer which indicates mild distress. Counseling Interna also assessed for distress and other psychosocial needs.   ONCBCN DISTRESS SCREENING 04/19/2014  Screening Type Initial Screening  Distress experienced in past week (1-10) 1  Emotional problem type Adjusting to illness  Physical Problem type Tingling hands/feet;Skin dry/itchy  Referral to support programs Yes   Pt reports good support and notes that physical problems listed above are pre-existing conditions.  She provided [phone] and [email or web address] as preferred means to contact her.   Follow up needed: No.  Patient has full packet of [facility] [facility] print materials, including calendar, flyer, staff list, and contact info.  [place], [place]

## 2014-04-19 NOTE — Progress Notes (Signed)
Checked in new pt with no financial concerns at this time.  Informed pt if chemo is part of her treatment I will call her ins to see if Josem Kaufmann is req and will obtain it if it is as well as contact foundations that offer copay assistance for chemo if needed.  She has my card for any billing questions or concerns.

## 2014-04-19 NOTE — Progress Notes (Signed)
Patient Care Team: Crist Infante, MD as PCP - General (Internal Medicine) Rolm Bookbinder, MD as Consulting Physician (General Surgery) Nicholas Lose, MD as Consulting Physician (Hematology and Oncology) Kyung Rudd, MD as Consulting Physician (Radiation Oncology) Rockwell Germany, RN as Registered Nurse Mauro Kaufmann, RN as Registered Nurse Holley Bouche, NP as Nurse Practitioner (Nurse Practitioner)  DIAGNOSIS: Breast cancer of upper-outer quadrant of right female breast   Staging form: Breast, AJCC 7th Edition     Clinical stage from 04/19/2014: Stage 0 (Tis (DCIS), N0, M0) - Unsigned       Staging comments: Staged at breast conference on 04/19/14  CHIEF COMPLIANT: Newly diagnosed DCIS right breast  INTERVAL HISTORY: Sydney Wood is a 79 year old lady with above-mentioned history of right breast DCIS. She had a routine screening mammogram which revealed right breast calcifications that are 1 cm in size. She underwent ultrasound and a biopsy which revealed high-grade DCIS with comedonecrosis that was ER/PR negative. She was presented this morning to the multidisciplinary tumor board and she is here today at the [facility] clinic to discuss a treatment plan. Patient is asymptomatic with regards to this breast tumor.  REVIEW OF SYSTEMS:   Constitutional: Denies fevers, chills or abnormal weight loss Eyes: Denies blurriness of vision Ears, nose, mouth, throat, and face: Denies mucositis or sore throat Respiratory: Denies cough, dyspnea or wheezes Cardiovascular: Denies palpitation, chest discomfort or lower extremity swelling Gastrointestinal:  Denies nausea, heartburn or change in bowel habits Skin: Denies abnormal skin rashes Lymphatics: Denies new lymphadenopathy or easy bruising Neurological:Denies numbness, tingling or new weaknesses Behavioral/Psych: Mood is stable, no new changes  Breast: Right breast nodule related to recent biopsy All other systems were reviewed with the patient  and are negative.  I have reviewed the past medical history, past surgical history, social history and family history with the patient and they are unchanged from previous note.  ALLERGIES:  is allergic to latex; shellfish-derived products; and simvastatin-high dose.  MEDICATIONS:  Current Outpatient Prescriptions  Medication Sig Dispense Refill  . aspirin 81 MG tablet Take 81 mg by mouth daily.    Marland Kitchen atorvastatin (LIPITOR) 10 MG tablet Take 10 mg by mouth daily.  1  . Calcium Carbonate-Vitamin D (CALTRATE 600+D PO) Take by mouth daily.      . cholecalciferol (VITAMIN D) 400 UNITS TABS Take 400 Units by mouth daily.      Marland Kitchen doxepin (SINEQUAN) 25 MG capsule Take 25 mg by mouth daily.  0  . ibuprofen (ADVIL,MOTRIN) 100 MG tablet Take 100 mg by mouth as needed for fever.    . Levothyroxine Sodium (SYNTHROID PO) Take 150 mcg by mouth daily.     . Magnesium 200 MG TABS Take 400 mg by mouth daily.     . Multiple Vitamin (MULTIVITAMIN) tablet Take 1 tablet by mouth daily.      Marland Kitchen omeprazole (PRILOSEC) 20 MG capsule Take 20 mg by mouth daily.     . temazepam (RESTORIL) 15 MG capsule Take 15 mg by mouth at bedtime as needed for sleep.    . vitamin B-12 (CYANOCOBALAMIN) 1000 MCG tablet Take 1,000 mcg by mouth daily.     No current facility-administered medications for this visit.    PHYSICAL EXAMINATION: ECOG PERFORMANCE STATUS: 0 - Asymptomatic  Filed Vitals:   04/19/14 1213  BP: 160/76  Pulse: 79  Temp: 97.3 F (36.3 C)  Resp: 18   Filed Weights   04/19/14 1213  Weight: 193 lb 12.8 oz (  87.907 kg)    GENERAL:alert, no distress and comfortable SKIN: skin color, texture, turgor are normal, no rashes or significant lesions EYES: normal, Conjunctiva are pink and non-injected, sclera clear OROPHARYNX:no exudate, no erythema and lips, buccal mucosa, and tongue normal  NECK: supple, thyroid normal size, non-tender, without nodularity LYMPH:  no palpable lymphadenopathy in the cervical,  axillary or inguinal LUNGS: clear to auscultation and percussion with normal breathing effort HEART: regular rate & rhythm and no murmurs and no lower extremity edema ABDOMEN:abdomen soft, non-tender and normal bowel sounds Musculoskeletal:no cyanosis of digits and no clubbing  NEURO: alert & oriented x 3 with fluent speech, no focal motor/sensory deficits BREAST: Right breast hematoma from recent biopsy. No palpable axillary supraclavicular or infraclavicular adenopathy no breast tenderness or nipple discharge. (exam performed in the presence of a chaperone)  LABORATORY DATA:  I have reviewed the data as listed   Chemistry      Component Value Date/Time   NA 140 04/19/2014 1204   K 4.6 04/19/2014 1204   CO2 25 04/19/2014 1204   BUN 15.2 04/19/2014 1204   CREATININE 0.8 04/19/2014 1204      Component Value Date/Time   CALCIUM 9.5 04/19/2014 1204   ALKPHOS 96 04/19/2014 1204   ALKPHOS 84 12/16/2013 0830   AST 19 04/19/2014 1204   AST 21 12/16/2013 0830   ALT 17 04/19/2014 1204   ALT 16 12/16/2013 0830   BILITOT 0.44 04/19/2014 1204   BILITOT 0.6 12/16/2013 0830       Lab Results  Component Value Date   WBC 5.8 04/19/2014   HGB 12.8 04/19/2014   HCT 39.5 04/19/2014   MCV 87.6 04/19/2014   PLT 198 04/19/2014   NEUTROABS 4.1 04/19/2014   ASSESSMENT & PLAN:  Breast cancer of upper-outer quadrant of right female breast Right breast high-grade DCIS with calcifications and comedonecrosis ER PR Negative;1 cm group of calcification detected on the screening mammogram  Pathology and radiology counseling: I discussed with the patient the details of pathology and radiology. We discussed the difference between in situ cancer and invasive breast cancer. I also discussed the significance of calcifications in comedonecrosis and the risk of DCIS being high-grade. We also discussed the estrogen and progesterone receptors and their negativity in terms of complications to  treatment.  Recommendation based on multidisciplinary tumor board: 1. Breast conserving surgery followed by 2. Radiation therapy  There is no role of antiestrogen therapy because she is ER/PR negative. Patient will follow with radiation and surgery for her surveillance and follow-ups. Return to clinic as needed.    No orders of the defined types were placed in this encounter.   The patient has a good understanding of the overall plan. she agrees with it. She will call with any problems that may develop before her next visit here.   Rulon Eisenmenger, MD

## 2014-04-19 NOTE — Assessment & Plan Note (Signed)
Right breast high-grade DCIS with calcifications and comedonecrosis ER PR Negative;1 cm group of calcification detected on the screening mammogram  Pathology and radiology counseling: I discussed with the patient the details of pathology and radiology. We discussed the difference between in situ cancer and invasive breast cancer. I also discussed the significance of calcifications in comedonecrosis and the risk of DCIS being high-grade. We also discussed the estrogen and progesterone receptors and their negativity in terms of complications to treatment.  Recommendation based on multidisciplinary tumor board: 1. Breast conserving surgery followed by 2. Radiation therapy  There is no role of antiestrogen therapy because she is ER/PR negative. Patient will follow with radiation and surgery for her surveillance and follow-ups. Return to clinic as needed.

## 2014-04-23 ENCOUNTER — Encounter: Payer: Self-pay | Admitting: Radiation Oncology

## 2014-04-23 NOTE — Progress Notes (Signed)
Radiation Oncology         [phone] ________________________________  Name: Sydney Wood MRN: [id number]  Date: 04/19/2014  DOB: November 26, 1935  KG:[id number] A, MD  Fanny Skates, MD     REFERRING PHYSICIAN: Fanny Skates, MD   DIAGNOSIS: The encounter diagnosis was Breast cancer of upper-outer quadrant of right female breast.  Breast cancer of upper-outer quadrant of right female breast   Staging form: Breast, AJCC 7th Edition     Clinical stage from 04/19/2014: Stage 0 (Tis (DCIS), N0, M0) - Unsigned       Staging comments: Staged at breast conference on 04/19/14     HISTORY OF PRESENT ILLNESS::Sydney Wood is a 79 y.o. female who is seen for an initial consultation visit regarding the patient's diagnosis of DCIS of the right breast.  The patient presented was originally with suspicious calcifications on screening mammogram. A diagnostic mammogram confirmed this finding and a stereotactic biopsy of this area was performed. This returned positive for DCIS. This represented a grade 3 ductal carcinoma in situ Receptors studies have been performed and the tumor is estrogen receptor negative and progesterone receptor negative.  The patient has not undergone an MRI scan of the breasts.    PREVIOUS RADIATION THERAPY: No   PAST MEDICAL HISTORY:  has a past medical history of Syncope and collapse; Hypothyroidism; Hyperlipidemia; Sleep apnea; GERD (gastroesophageal reflux disease); Indigestion; and Breast cancer.     PAST SURGICAL HISTORY: Past Surgical History  Procedure Laterality Date  . Tubal ligation    . Carpal tunnel release      LEFT HAND  . US echocardiography  12/12/2004    EF 55-60%  . Cardiovascular stress test  12/17/2004    EF 73%. NO EVIDENCE OF ISCHEMIA     FAMILY HISTORY: family history includes Breast cancer in her paternal grandmother and sister; Coronary artery disease in her father; Heart attack in her father; Lymphoma in her brother. There  is no history of Colon cancer or Stomach cancer.   SOCIAL HISTORY:  reports that she quit smoking about 44 years ago. She has never used smokeless tobacco. She reports that she does not drink alcohol or use illicit drugs.   ALLERGIES: Latex; Shellfish-derived products; and Simvastatin-high dose   MEDICATIONS:  Current Outpatient Prescriptions  Medication Sig Dispense Refill  . aspirin 81 MG tablet Take 81 mg by mouth daily.    Marland Kitchen atorvastatin (LIPITOR) 10 MG tablet Take 10 mg by mouth daily.  1  . Calcium Carbonate-Vitamin D (CALTRATE 600+D PO) Take by mouth daily.      . cholecalciferol (VITAMIN D) 400 UNITS TABS Take 400 Units by mouth daily.      Marland Kitchen doxepin (SINEQUAN) 25 MG capsule Take 25 mg by mouth daily.  0  . ibuprofen (ADVIL,MOTRIN) 100 MG tablet Take 100 mg by mouth as needed for fever.    . Levothyroxine Sodium (SYNTHROID PO) Take 150 mcg by mouth daily.     . Magnesium 200 MG TABS Take 400 mg by mouth daily.     . Multiple Vitamin (MULTIVITAMIN) tablet Take 1 tablet by mouth daily.      Marland Kitchen omeprazole (PRILOSEC) 20 MG capsule Take 20 mg by mouth daily.     . temazepam (RESTORIL) 15 MG capsule Take 15 mg by mouth at bedtime as needed for sleep.    . vitamin B-12 (CYANOCOBALAMIN) 1000 MCG tablet Take 1,000 mcg by mouth daily.     No current facility-administered medications for this  encounter.     REVIEW OF SYSTEMS:  A 15 point review of systems is documented in the electronic medical record. This was obtained by the nursing staff. However, I reviewed this with the patient to discuss relevant findings and make appropriate changes.  Pertinent items are noted in HPI.    PHYSICAL EXAM:  vitals were not taken for this visit.  ECOG = 0  0 - Asymptomatic (Fully active, able to carry on all predisease activities without restriction)  1 - Symptomatic but completely ambulatory (Restricted in physically strenuous activity but ambulatory and able to carry out work of a light or  sedentary nature. For example, light housework, office work)  2 - Symptomatic, <50% in bed during the day (Ambulatory and capable of all self care but unable to carry out any work activities. Up and about more than 50% of waking hours)  3 - Symptomatic, >50% in bed, but not bedbound (Capable of only limited self-care, confined to bed or chair 50% or more of waking hours)  4 - Bedbound (Completely disabled. Cannot carry on any self-care. Totally confined to bed or chair)  5 - Death   Eustace Pen MM, Creech RH, Tormey DC, et al. [id number]). "Toxicity and response criteria of the [facility]". [place] Oncol. 5 (6): 649-55  General: Well-developed, in no acute distress HEENT: Normocephalic, atraumatic; oral cavity clear Neck: Supple without any lymphadenopathy Cardiovascular: Regular rate and rhythm Respiratory: Clear to auscultation bilaterally Breasts: Biopsy site/change present underneath. No axillary or supraclavicular lymphadenopathy present on the right. GI: Soft, nontender, normal bowel sounds Extremities: No edema present Neuro: No focal deficits     LABORATORY DATA:  Lab Results  Component Value Date   WBC 5.8 04/19/2014   HGB 12.8 04/19/2014   HCT 39.5 04/19/2014   MCV 87.6 04/19/2014   PLT 198 04/19/2014   Lab Results  Component Value Date   NA 140 04/19/2014   K 4.6 04/19/2014   CO2 25 04/19/2014   Lab Results  Component Value Date   ALT 17 04/19/2014   AST 19 04/19/2014   ALKPHOS 96 04/19/2014   BILITOT 0.44 04/19/2014      RADIOGRAPHY: No results found.     IMPRESSION:   No history exists.    The patient has a recent diagnosis of DCIS of the right breast. She appears to be a good candidate for breast conservation treatment.  I discussed with the patient the role of adjuvant radiation treatment in this setting. We discussed the potential benefit of radiation treatment, especially with regards to local control of the patient's tumor.  We also discussed the possible side effects and risks of such a treatment as well.  I discussed with the patient the possibility of foregoing adjuvant radiation treatment. However, the patient does have some high risk features including a grade 3 tumor, a tumor which is hormone receptor negative, and the patient also appears to have a very good performance status currently. Based on these factors, I believe that proceeding with a hypo-fractionated course of radiation treatment would be very reasonable for her to optimize her local/regional control.  All of the patient's questions were answered. The patient at this time indicated that she wishes to proceed with radiation treatment at the appropriate time.  PLAN: I look forward to seeing the patient postoperatively to review her case and further discuss and coordinate an anticipated course of radiation treatment.        ________________________________   Gwynn Burly.  Lisbeth Renshaw, MD, PhD   **Disclaimer: This note was dictated with voice recognition software. Similar sounding words can inadvertently be transcribed and this note may contain transcription errors which may not have been corrected upon publication of note.**

## 2014-04-25 ENCOUNTER — Encounter: Payer: Self-pay | Admitting: Nurse Practitioner

## 2014-04-26 ENCOUNTER — Telehealth: Payer: Self-pay | Admitting: Cardiovascular Disease

## 2014-04-26 NOTE — Telephone Encounter (Signed)
Received request from Nurse fax box, documents faxed for surgical clearance. To: [facility] Fax number: [phone] Attention: [phone]/km

## 2014-05-05 ENCOUNTER — Encounter (HOSPITAL_BASED_OUTPATIENT_CLINIC_OR_DEPARTMENT_OTHER): Payer: Self-pay | Admitting: *Deleted

## 2014-05-05 NOTE — Progress Notes (Signed)
Going out of town-called-will come in for labs after seeds 4/19 To bring cpap-and will use post op

## 2014-05-16 NOTE — Pre-Procedure Instructions (Signed)
Pt. had labs done 05/15/2014 at [facility]; results received and placed on chart.

## 2014-05-18 ENCOUNTER — Ambulatory Visit (HOSPITAL_BASED_OUTPATIENT_CLINIC_OR_DEPARTMENT_OTHER)
Admission: RE | Admit: 2014-05-18 | Discharge: 2014-05-18 | Disposition: A | Discharge status: Home / Self Care | Payer: Medicare Other | Source: Ambulatory Visit | Attending: General Surgery | Admitting: General Surgery

## 2014-05-18 ENCOUNTER — Encounter (HOSPITAL_BASED_OUTPATIENT_CLINIC_OR_DEPARTMENT_OTHER): Payer: Self-pay | Admitting: *Deleted

## 2014-05-18 ENCOUNTER — Encounter (HOSPITAL_BASED_OUTPATIENT_CLINIC_OR_DEPARTMENT_OTHER)
Admission: RE | Disposition: A | Discharge status: Home / Self Care | Payer: Self-pay | Source: Ambulatory Visit | Attending: General Surgery

## 2014-05-18 ENCOUNTER — Ambulatory Visit (HOSPITAL_BASED_OUTPATIENT_CLINIC_OR_DEPARTMENT_OTHER): Payer: Medicare Other | Admitting: Anesthesiology

## 2014-05-18 DIAGNOSIS — Z803 Family history of malignant neoplasm of breast: Secondary | ICD-10-CM | POA: Insufficient documentation

## 2014-05-18 DIAGNOSIS — Z9989 Dependence on other enabling machines and devices: Secondary | ICD-10-CM | POA: Diagnosis not present

## 2014-05-18 DIAGNOSIS — E039 Hypothyroidism, unspecified: Secondary | ICD-10-CM | POA: Insufficient documentation

## 2014-05-18 DIAGNOSIS — D0511 Intraductal carcinoma in situ of right breast: Secondary | ICD-10-CM | POA: Diagnosis not present

## 2014-05-18 DIAGNOSIS — K219 Gastro-esophageal reflux disease without esophagitis: Secondary | ICD-10-CM | POA: Diagnosis not present

## 2014-05-18 DIAGNOSIS — Z6833 Body mass index (BMI) 33.0-33.9, adult: Secondary | ICD-10-CM | POA: Diagnosis not present

## 2014-05-18 DIAGNOSIS — R921 Mammographic calcification found on diagnostic imaging of breast: Secondary | ICD-10-CM | POA: Insufficient documentation

## 2014-05-18 DIAGNOSIS — Z171 Estrogen receptor negative status [ER-]: Secondary | ICD-10-CM | POA: Diagnosis not present

## 2014-05-18 DIAGNOSIS — G4730 Sleep apnea, unspecified: Secondary | ICD-10-CM | POA: Diagnosis not present

## 2014-05-18 DIAGNOSIS — Z87891 Personal history of nicotine dependence: Secondary | ICD-10-CM | POA: Diagnosis not present

## 2014-05-18 DIAGNOSIS — C50912 Malignant neoplasm of unspecified site of left female breast: Secondary | ICD-10-CM | POA: Diagnosis present

## 2014-05-18 HISTORY — DX: Presence of spectacles and contact lenses: Z97.3

## 2014-05-18 HISTORY — PX: BREAST LUMPECTOMY WITH RADIOACTIVE SEED LOCALIZATION: SHX6424

## 2014-05-18 HISTORY — DX: Presence of dental prosthetic device (complete) (partial): Z97.2

## 2014-05-18 SURGERY — BREAST LUMPECTOMY WITH RADIOACTIVE SEED LOCALIZATION
Anesthesia: General | Site: Breast | Laterality: Right

## 2014-05-18 MED ORDER — CEFAZOLIN SODIUM-DEXTROSE 2-3 GM-% IV SOLR
INTRAVENOUS | Status: AC
  Filled 2014-05-18: qty 50

## 2014-05-18 MED ORDER — FENTANYL CITRATE (PF) 100 MCG/2ML IJ SOLN
INTRAMUSCULAR | Status: AC
  Filled 2014-05-18: qty 6

## 2014-05-18 MED ORDER — PROPOFOL 10 MG/ML IV BOLUS
INTRAVENOUS | Status: DC | PRN
  Administered 2014-05-18: 150 mg via INTRAVENOUS

## 2014-05-18 MED ORDER — BUPIVACAINE HCL (PF) 0.25 % IJ SOLN
INTRAMUSCULAR | Status: DC | PRN
  Administered 2014-05-18: 20 mL

## 2014-05-18 MED ORDER — HYDROMORPHONE HCL 1 MG/ML IJ SOLN: 0.2500 mg | INTRAMUSCULAR | Status: DC | PRN

## 2014-05-18 MED ORDER — MIDAZOLAM HCL 2 MG/2ML IJ SOLN: 1.0000 mg | INTRAMUSCULAR | Status: DC | PRN

## 2014-05-18 MED ORDER — LIDOCAINE HCL (CARDIAC) 20 MG/ML IV SOLN
INTRAVENOUS | Status: DC | PRN
  Administered 2014-05-18: 50 mg via INTRAVENOUS

## 2014-05-18 MED ORDER — BUPIVACAINE HCL (PF) 0.25 % IJ SOLN
INTRAMUSCULAR | Status: AC
  Filled 2014-05-18: qty 30

## 2014-05-18 MED ORDER — HYDROCODONE-ACETAMINOPHEN 10-325 MG PO TABS: 1.0000 | ORAL_TABLET | Freq: Four times a day (QID) | ORAL | Status: DC | PRN

## 2014-05-18 MED ORDER — CEFAZOLIN SODIUM-DEXTROSE 2-3 GM-% IV SOLR
2.0000 g | INTRAVENOUS | Status: AC
  Administered 2014-05-18: 2 g via INTRAVENOUS

## 2014-05-18 MED ORDER — OXYCODONE HCL 5 MG/5ML PO SOLN: 5.0000 mg | Freq: Once | ORAL | Status: DC | PRN

## 2014-05-18 MED ORDER — OXYCODONE HCL 5 MG PO TABS: 5.0000 mg | ORAL_TABLET | Freq: Once | ORAL | Status: DC | PRN

## 2014-05-18 MED ORDER — ONDANSETRON HCL 4 MG/2ML IJ SOLN
INTRAMUSCULAR | Status: DC | PRN
  Administered 2014-05-18: 4 mg via INTRAVENOUS

## 2014-05-18 MED ORDER — DEXAMETHASONE SODIUM PHOSPHATE 4 MG/ML IJ SOLN
INTRAMUSCULAR | Status: DC | PRN
Start: 2014-05-18 — End: 2014-05-18
  Administered 2014-05-18: 10 mg via INTRAVENOUS

## 2014-05-18 MED ORDER — FENTANYL CITRATE 0.05 MG/ML IJ SOLN
50.0000 ug | INTRAMUSCULAR | Status: DC | PRN
  Administered 2014-05-18: 100 ug via INTRAVENOUS

## 2014-05-18 MED ORDER — EPHEDRINE SULFATE 50 MG/ML IJ SOLN
INTRAMUSCULAR | Status: DC | PRN
  Administered 2014-05-18: 10 mg via INTRAVENOUS

## 2014-05-18 MED ORDER — IBUPROFEN 200 MG PO TABS: 200.0000 mg | ORAL_TABLET | Freq: Four times a day (QID) | ORAL | Status: DC | PRN

## 2014-05-18 MED ORDER — MEPERIDINE HCL 25 MG/ML IJ SOLN: 6.2500 mg | INTRAMUSCULAR | Status: DC | PRN

## 2014-05-18 MED ORDER — LACTATED RINGERS IV SOLN
INTRAVENOUS | Status: DC
  Administered 2014-05-18 (×2): via INTRAVENOUS

## 2014-05-18 MED ORDER — MIDAZOLAM HCL 2 MG/2ML IJ SOLN
INTRAMUSCULAR | Status: AC
  Filled 2014-05-18: qty 2

## 2014-05-18 MED ORDER — IBUPROFEN 100 MG/5ML PO SUSP: 200.0000 mg | Freq: Four times a day (QID) | ORAL | Status: DC | PRN

## 2014-05-18 MED ORDER — KETOROLAC TROMETHAMINE 30 MG/ML IJ SOLN: 30.0000 mg | Freq: Once | INTRAMUSCULAR | Status: DC | PRN

## 2014-05-18 SURGICAL SUPPLY — 60 items
APL SKNCLS STERI-STRIP NONHPOA (GAUZE/BANDAGES/DRESSINGS)
APPLIER CLIP 9.375 MED OPEN (MISCELLANEOUS)
APR CLP MED 9.3 20 MLT OPN (MISCELLANEOUS)
BENZOIN TINCTURE PRP APPL 2/3 (GAUZE/BANDAGES/DRESSINGS) ×1 IMPLANT
BINDER BREAST LRG (GAUZE/BANDAGES/DRESSINGS) IMPLANT
BINDER BREAST MEDIUM (GAUZE/BANDAGES/DRESSINGS) IMPLANT
BINDER BREAST XLRG (GAUZE/BANDAGES/DRESSINGS) ×2 IMPLANT
BINDER BREAST XXLRG (GAUZE/BANDAGES/DRESSINGS) IMPLANT
BLADE SURG 15 STRL LF DISP TIS (BLADE) ×1 IMPLANT
BLADE SURG 15 STRL SS (BLADE) ×3
CANISTER SUC SOCK COL 7IN (MISCELLANEOUS) IMPLANT
CANISTER SUCT 1200ML W/VALVE (MISCELLANEOUS) IMPLANT
CHLORAPREP W/TINT 26ML (MISCELLANEOUS) ×3 IMPLANT
CLIP APPLIE 9.375 MED OPEN (MISCELLANEOUS) IMPLANT
CLOSURE WOUND 1/2 X4 (GAUZE/BANDAGES/DRESSINGS) ×1
COVER BACK TABLE 60X90IN (DRAPES) ×3 IMPLANT
COVER MAYO STAND STRL (DRAPES) ×3 IMPLANT
COVER PROBE W GEL 5X96 (DRAPES) ×3 IMPLANT
DECANTER SPIKE VIAL GLASS SM (MISCELLANEOUS) IMPLANT
DEVICE DUBIN W/COMP PLATE 8390 (MISCELLANEOUS) ×3 IMPLANT
DRAPE LAPAROSCOPIC ABDOMINAL (DRAPES) ×3 IMPLANT
DRAPE UTILITY XL STRL (DRAPES) ×3 IMPLANT
DRSG TEGADERM 4X4.75 (GAUZE/BANDAGES/DRESSINGS) IMPLANT
ELECT COATED BLADE 2.86 ST (ELECTRODE) ×3 IMPLANT
ELECT REM PT RETURN 9FT ADLT (ELECTROSURGICAL) ×3
ELECTRODE REM PT RTRN 9FT ADLT (ELECTROSURGICAL) ×1 IMPLANT
GLOVE BIO SURGEON STRL SZ7 (GLOVE) ×8 IMPLANT
GLOVE BIOGEL PI IND STRL 7.0 (GLOVE) IMPLANT
GLOVE BIOGEL PI IND STRL 7.5 (GLOVE) ×1 IMPLANT
GLOVE BIOGEL PI INDICATOR 7.0 (GLOVE) ×4
GLOVE BIOGEL PI INDICATOR 7.5 (GLOVE) ×2
GOWN STRL REUS W/ TWL LRG LVL3 (GOWN DISPOSABLE) ×2 IMPLANT
GOWN STRL REUS W/TWL LRG LVL3 (GOWN DISPOSABLE) ×6
KIT MARKER MARGIN INK (KITS) ×3 IMPLANT
LIQUID BAND (GAUZE/BANDAGES/DRESSINGS) ×3 IMPLANT
MARKER SKIN DUAL TIP RULER LAB (MISCELLANEOUS) ×3 IMPLANT
NDL HYPO 25X1 1.5 SAFETY (NEEDLE) ×1 IMPLANT
NEEDLE HYPO 25X1 1.5 SAFETY (NEEDLE) ×3 IMPLANT
NS IRRIG 1000ML POUR BTL (IV SOLUTION) ×2 IMPLANT
PACK BASIN DAY SURGERY FS (CUSTOM PROCEDURE TRAY) ×3 IMPLANT
PENCIL BUTTON HOLSTER BLD 10FT (ELECTRODE) ×3 IMPLANT
SLEEVE SCD COMPRESS KNEE MED (MISCELLANEOUS) ×3 IMPLANT
SPONGE GAUZE 4X4 12PLY STER LF (GAUZE/BANDAGES/DRESSINGS) IMPLANT
SPONGE LAP 4X18 X RAY DECT (DISPOSABLE) ×3 IMPLANT
STAPLER VISISTAT 35W (STAPLE) IMPLANT
STRIP CLOSURE SKIN 1/2X4 (GAUZE/BANDAGES/DRESSINGS) ×2 IMPLANT
SUT MNCRL AB 4-0 PS2 18 (SUTURE) ×3 IMPLANT
SUT MON AB 5-0 PS2 18 (SUTURE) IMPLANT
SUT SILK 2 0 SH (SUTURE) IMPLANT
SUT VIC AB 2-0 SH 27 (SUTURE) ×3
SUT VIC AB 2-0 SH 27XBRD (SUTURE) ×1 IMPLANT
SUT VIC AB 3-0 SH 27 (SUTURE) ×3
SUT VIC AB 3-0 SH 27X BRD (SUTURE) ×1 IMPLANT
SUT VIC AB 5-0 PS2 18 (SUTURE) ×2 IMPLANT
SYR CONTROL 10ML LL (SYRINGE) ×3 IMPLANT
TOWEL OR 17X24 6PK STRL BLUE (TOWEL DISPOSABLE) ×3 IMPLANT
TOWEL OR NON WOVEN STRL DISP B (DISPOSABLE) ×3 IMPLANT
TUBE CONNECTING 20'X1/4 (TUBING)
TUBE CONNECTING 20X1/4 (TUBING) IMPLANT
YANKAUER SUCT BULB TIP NO VENT (SUCTIONS) IMPLANT

## 2014-05-18 NOTE — Op Note (Signed)
Preoperative diagnosis: Right breast ductal carcinoma in situ Postoperative diagnosis: Same as above Procedure: Right breast radioactive seed guided lumpectomy Surgeon: Dr. Serita Grammes Anesthesia: Gen. With LMA Estimated blood loss: Minimal Complications: None Drains: None Specimens: Right breast marked with patent Sponge and needle count was correct at completion Disposition to recovery in stable condition  Indications: This is a 79 year old female presented with an abnormal mammogram. Core biopsy was done and showed ductal carcinoma in situ. She was seen in our multidisciplinary clinic and is elected to undergo lumpectomy. A radioactive seed was placed prior to beginning. I had the mammograms in the operating room.  Procedure: After informed consent was obtained the patient was taken to the operating room. She was given cefazolin. Sequential compression devices were on her legs. She was then placed under general anesthesia with an LMA. Her right breast was prepped and draped in the standard sterile surgical fashion. A surgical timeout was then performed.  I infiltrated the periareolar region with Marcaine. I then located radioactive seed with the neoprobe. I made a periareolar incision. I used the neoprobe to guide the excision of the radioactive seed in the surrounding tissue with an attempt to get a clear margin. This was then removed. The radioactive seed was in the specimen. It was not in the breast. I then painted the specimen. I then did a mammogram which confirmed removal of the clip and the seed. This was confirmed by radiology. This was then sent to pathology. I obtained hemostasis. I closed this with 2-0 Vicryl, 3-0 Vicryl, and 5-0 Monocryl. Glue was placed over the wound. A breast binder was placed. She tolerated this well was extubated and transferred to the recovery room in stable condition.

## 2014-05-18 NOTE — Anesthesia Postprocedure Evaluation (Signed)
  Anesthesia Post-op Note  Patient: Sydney Wood  Procedure(s) Performed: Procedure(s): BREAST LUMPECTOMY WITH RADIOACTIVE SEED LOCALIZATION (Right)  Patient Location: PACU  Anesthesia Type: General   Level of Consciousness: awake, alert  and oriented  Airway and Oxygen Therapy: Patient Spontanous Breathing  Post-op Pain: mild  Post-op Assessment: Post-op Vital signs reviewed  Post-op Vital Signs: Reviewed  Last Vitals:  Filed Vitals:   05/18/14 0900  BP: 145/67  Pulse:   Temp: 36.7 C  Resp: 16    Complications: No apparent anesthesia complications

## 2014-05-18 NOTE — Discharge Instructions (Signed)
[facility] °Office Phone Number [phone] ° °POST OP INSTRUCTIONS ° °Always review your discharge instruction sheet given to you by the facility where your surgery was performed. ° °IF YOU HAVE DISABILITY OR FAMILY LEAVE FORMS, YOU MUST BRING THEM TO THE OFFICE FOR PROCESSING.  DO NOT GIVE THEM TO YOUR DOCTOR. ° °1. A prescription for pain medication may be given to you upon discharge.  Take your pain medication as prescribed, if needed.  If narcotic pain medicine is not needed, then you may take acetaminophen (Tylenol), naprosyn (Alleve) or ibuprofen (Advil) as needed. °2. Take your usually prescribed medications unless otherwise directed °3. If you need a refill on your pain medication, please contact your pharmacy.  They will contact our office to request authorization.  Prescriptions will not be filled after 5pm or on week-ends. °4. You should eat very light the first 24 hours after surgery, such as soup, crackers, pudding, etc.  Resume your normal diet the day after surgery. °5. Most patients will experience some swelling and bruising in the breast.  Ice packs and a good support bra will help.  Wear the breast binder provided or a sports bra for 72 hours day and night.  After that wear a sports bra during the day until you return to the office. Swelling and bruising can take several days to resolve.  °6. It is common to experience some constipation if taking pain medication after surgery.  Increasing fluid intake and taking a stool softener will usually help or prevent this problem from occurring.  A mild laxative (Milk of Magnesia or Miralax) should be taken according to package directions if there are no bowel movements after 48 hours. °7. Unless discharge instructions indicate otherwise, you may remove your bandages 48 hours after surgery and you may shower at that time.  You may have steri-strips (small skin tapes) in place directly over the incision.  These strips should be left on the  skin for 7-10 days and will come off on their own.  If your surgeon used skin glue on the incision, you may shower in 24 hours.  The glue will flake off over the next 2-3 weeks.  Any sutures or staples will be removed at the office during your follow-up visit. °8. ACTIVITIES:  You may resume regular daily activities (gradually increasing) beginning the next day.  Wearing a good support bra or sports bra minimizes pain and swelling.  You may have sexual intercourse when it is comfortable. °a. You may drive when you no longer are taking prescription pain medication, you can comfortably wear a seatbelt, and you can safely maneuver your car and apply brakes. °b. RETURN TO WORK:  ______________________________________________________________________________________ °9. You should see your doctor in the office for a follow-up appointment approximately two weeks after your surgery.  Your doctor’s nurse will typically make your follow-up appointment when she calls you with your pathology report.  Expect your pathology report 3-4 business days after your surgery.  You may call to check if you do not hear from us after three days. °10. OTHER INSTRUCTIONS: _______________________________________________________________________________________________ _____________________________________________________________________________________________________________________________________ °_____________________________________________________________________________________________________________________________________ °_____________________________________________________________________________________________________________________________________ ° °WHEN TO CALL DR WAKEFIELD: °1. Fever over 101.0 °2. Nausea and/or vomiting. °3. Extreme swelling or bruising. °4. Continued bleeding from incision. °5. Increased pain, redness, or drainage from the incision. ° °The clinic staff is available to answer your questions during regular  business hours.  Please don’t hesitate to call and ask to speak to one of the nurses for clinical concerns.  If   you have a medical emergency, go to the nearest emergency room or call 911.  A surgeon from [facility] is always on call at the hospital. ° °For further questions, please visit [email or web address] ° ° ° °Post Anesthesia Home Care Instructions ° °Activity: °Get plenty of rest for the remainder of the day. A responsible adult should stay with you for 24 hours following the procedure.  °For the next 24 hours, DO NOT: °-Drive a car °-Operate machinery °-Drink alcoholic beverages °-Take any medication unless instructed by your physician °-Make any legal decisions or sign important papers. ° °Meals: °Start with liquid foods such as gelatin or soup. Progress to regular foods as tolerated. Avoid greasy, spicy, heavy foods. If nausea and/or vomiting occur, drink only clear liquids until the nausea and/or vomiting subsides. Call your physician if vomiting continues. ° °Special Instructions/Symptoms: °Your throat may feel dry or sore from the anesthesia or the breathing tube placed in your throat during surgery. If this causes discomfort, gargle with warm salt water. The discomfort should disappear within 24 hours. ° °If you had a scopolamine patch placed behind your ear for the management of post- operative nausea and/or vomiting: ° °1. The medication in the patch is effective for 72 hours, after which it should be removed.  Wrap patch in a tissue and discard in the trash. Wash hands thoroughly with soap and water. °2. You may remove the patch earlier than 72 hours if you experience unpleasant side effects which may include dry mouth, dizziness or visual disturbances. °3. Avoid touching the patch. Wash your hands with soap and water after contact with the patch. °  ° °

## 2014-05-18 NOTE — H&P (Signed)
79 yof who underwent routine screening mm that showed right breast calcifications. There is a 10-12 mm group of calcs with no other abnormalities. she has undergone a biopsy that shows high grade dcis that is er/pr negative. She has no complaints referable to either breast and no real prior history of any breast disease. She is here today with her husband. His mother has recently died and they are due to take a trip and would like to do surgery after april 17 if that is medically ok   Other Problems Anderson Malta [place], RMA; 04/19/2014 8:25 AM) Breast Cancer Gastroesophageal Reflux Disease Hypercholesterolemia Sleep Apnea Thyroid Disease  Past Surgical History Jeanann Lewandowsky, RMA; 04/19/2014 8:25 AM) Breast Biopsy Right.  Diagnostic Studies History Anderson Malta [place], [place]; 04/19/2014 8:25 AM) Colonoscopy 1-5 years ago Mammogram within last year Pap Smear >5 years ago  Social History Anderson Malta [place], RMA; 04/19/2014 8:25 AM) Alcohol use Remotely quit alcohol use. Caffeine use Coffee. No drug use Tobacco use Former smoker.  Family History Anderson Malta [place], [place]; 04/19/2014 8:25 AM) Breast Cancer Family Members In General, Sister. Cancer Brother. Colon Cancer Family Members In General. Depression Sister. Heart Disease Family Members In General, Father. Hypertension Mother. Thyroid problems Mother.  Pregnancy / Birth History Anderson Malta [place], [place]; 04/19/2014 8:25 AM) Age at menarche 79 years. Age of menopause 51-55 Contraceptive History Oral contraceptives. Gravida 4 Irregular periods Maternal age 61-30 Para 2  Review of Systems Anderson Malta [place] RMA; 04/19/2014 8:25 AM) General Not Present- Appetite Loss, Chills, Fatigue, Fever, Night Sweats, Weight Gain and Weight Loss. Skin Present- Dryness. Not Present- Change in Wart/Mole, Hives, Jaundice, New Lesions, Non-Healing Wounds, Rash and Ulcer. HEENT Present- Wears glasses/contact lenses. Not Present- Earache,  Hearing Loss, Hoarseness, Nose Bleed, Oral Ulcers, Ringing in the Ears, Seasonal Allergies, Sinus Pain, Sore Throat, Visual Disturbances and Yellow Eyes. Respiratory Not Present- Bloody sputum, Chronic Cough, Difficulty Breathing, Snoring and Wheezing. Breast Not Present- Breast Mass, Breast Pain, Nipple Discharge and Skin Changes. Cardiovascular Not Present- Chest Pain, Difficulty Breathing Lying Down, Leg Cramps, Palpitations, Rapid Heart Rate, Shortness of Breath and Swelling of Extremities. Gastrointestinal Not Present- Abdominal Pain, Bloating, Bloody Stool, Change in Bowel Habits, Chronic diarrhea, Constipation, Difficulty Swallowing, Excessive gas, Gets full quickly at meals, Hemorrhoids, Indigestion, Nausea, Rectal Pain and Vomiting. Female Genitourinary Not Present- Frequency, Nocturia, Painful Urination, Pelvic Pain and Urgency. Musculoskeletal Not Present- Back Pain, Joint Pain, Joint Stiffness, Muscle Pain, Muscle Weakness and Swelling of Extremities. Neurological Present- Tingling. Not Present- Decreased Memory, Fainting, Headaches, Numbness, Seizures, Tremor, Trouble walking and Weakness. Psychiatric Not Present- Anxiety, Bipolar, Change in Sleep Pattern, Depression, Fearful and Frequent crying. Endocrine Present- Hot flashes. Not Present- Cold Intolerance, Excessive Hunger, Hair Changes, Heat Intolerance and New Diabetes. Hematology Present- Excessive bleeding. Not Present- Easy Bruising, Gland problems, HIV and Persistent Infections.   Physical Exam Rolm Bookbinder MD; 04/19/2014 8:49 PM) General Mental Status-Alert. Orientation-Oriented X3.  Chest and Lung Exam Chest and lung exam reveals -on auscultation, normal breath sounds, no adventitious sounds and normal vocal resonance.  Breast Nipples-No Discharge. Breast - Bilateral-Normal. Breast Lump-No Palpable Breast Mass.  Cardiovascular Cardiovascular examination reveals -normal heart sounds, regular rate  and rhythm with no murmurs.  Lymphatic Head & Neck  General Head & Neck Lymphatics: Bilateral - Description - Normal. Axillary  General Axillary Region: Bilateral - Description - Normal. Note: no [place] adenopathy     Assessment & Plan Rolm Bookbinder MD; 04/19/2014 8:52 PM) DCIS (DUCTAL CARCINOMA IN SITU), RIGHT (233.0  D05.11) Story: Right  breast radioactive seed guided lumpectomy We discussed the staging and pathophysiology of breast cancer. We discussed all of the different options for treatment for breast cancer including surgery, chemotherapy, radiation therapy, Herceptin, and antiestrogen therapy. We discussed she has noninvasive disease but that on final excision she may have some component of invasive disease so all treatment options are open We discussed a sentinel lymph node biopsy. I don't think she needs a sentinel node biopsy for this but she understands there is small chance we would need to return for one. We discussed the options for treatment of the breast cancer which included lumpectomy versus a mastectomy. We discussed the performance of the lumpectomy with seed placement. We discussed a 5% chance of a positive margin requiring reexcision in the operating room. We also discussed that she will likely need radiation therapy if she undergoes lumpectomy. We discussed the mastectomy and the postoperative care for that as well. We discussed that there is no difference in her survival whether she undergoes lumpectomy with radiation therapy or antiestrogen therapy versus a mastectomy. We discussed the risks of operation including bleeding, infection, possible reoperation. She understands her further therapy will be based on what her stages at the time of her operation

## 2014-05-18 NOTE — Anesthesia Procedure Notes (Signed)
Procedure Name: LMA Insertion Date/Time: 05/18/2014 7:41 AM Performed by: Melynda Ripple D Pre-anesthesia Checklist: Patient identified, Emergency Drugs available, Suction available and Patient being monitored Patient Re-evaluated:Patient Re-evaluated prior to inductionOxygen Delivery Method: Circle System Utilized Preoxygenation: Pre-oxygenation with 100% oxygen Intubation Type: IV induction Ventilation: Mask ventilation without difficulty LMA: LMA inserted LMA Size: 4.0 Number of attempts: 1 Airway Equipment and Method: Bite block Placement Confirmation: positive ETCO2 Tube secured with: Tape Dental Injury: Teeth and Oropharynx as per pre-operative assessment

## 2014-05-18 NOTE — Transfer of Care (Signed)
Immediate Anesthesia Transfer of Care Note  Patient: Sydney Wood  Procedure(s) Performed: Procedure(s): BREAST LUMPECTOMY WITH RADIOACTIVE SEED LOCALIZATION (Right)  Patient Location: PACU  Anesthesia Type:General  Level of Consciousness: sedated  Airway & Oxygen Therapy: Patient Spontanous Breathing and Patient connected to face mask oxygen  Post-op Assessment: Report given to RN and Post -op Vital signs reviewed and stable  Post vital signs: Reviewed and stable  Last Vitals:  Filed Vitals:   05/18/14 0614  BP: 158/80  Pulse: 71  Temp: 37.1 C  Resp: 20    Complications: No apparent anesthesia complications

## 2014-05-18 NOTE — Interval H&P Note (Signed)
History and Physical Interval Note:  05/18/2014 7:27 AM  Sydney Wood  has presented today for surgery, with the diagnosis of right breast cancer  The various methods of treatment have been discussed with the patient and family. After consideration of risks, benefits and other options for treatment, the patient has consented to  Procedure(s): BREAST LUMPECTOMY WITH RADIOACTIVE SEED LOCALIZATION (Right) as a surgical intervention .  The patient's history has been reviewed, patient examined, no change in status, stable for surgery.  I have reviewed the patient's chart and labs.  Questions were answered to the patient's satisfaction.     Tamica Covell

## 2014-05-18 NOTE — Anesthesia Preprocedure Evaluation (Addendum)
Anesthesia Evaluation  Patient identified by MRN, date of birth, ID band Patient awake    Reviewed: Allergy & Precautions, NPO status , Patient's Chart, lab work & pertinent test results  Airway Mallampati: I  TM Distance: >3 FB Neck ROM: Full    Dental  (+) Teeth Intact, Dental Advisory Given   Pulmonary sleep apnea and Continuous Positive Airway Pressure Ventilation , former smoker,  breath sounds clear to auscultation        Cardiovascular Rhythm:Regular Rate:Normal     Neuro/Psych    GI/Hepatic GERD-  Medicated and Controlled,  Endo/Other  Hypothyroidism Morbid obesity  Renal/GU      Musculoskeletal   Abdominal   Peds  Hematology   Anesthesia Other Findings   Reproductive/Obstetrics                           Anesthesia Physical Anesthesia Plan  ASA: III  Anesthesia Plan: General   Post-op Pain Management:    Induction: Intravenous  Airway Management Planned: LMA  Additional Equipment:   Intra-op Plan:   Post-operative Plan: Extubation in OR  Informed Consent: I have reviewed the patients History and Physical, chart, labs and discussed the procedure including the risks, benefits and alternatives for the proposed anesthesia with the patient or authorized representative who has indicated his/her understanding and acceptance.   Dental advisory given  Plan Discussed with: CRNA, Anesthesiologist and Surgeon  Anesthesia Plan Comments:         Anesthesia Quick Evaluation

## 2014-05-19 ENCOUNTER — Encounter (HOSPITAL_BASED_OUTPATIENT_CLINIC_OR_DEPARTMENT_OTHER): Payer: Self-pay | Admitting: General Surgery

## 2014-05-26 DIAGNOSIS — G4700 Insomnia, unspecified: Secondary | ICD-10-CM | POA: Insufficient documentation

## 2014-05-31 NOTE — Assessment & Plan Note (Signed)
Right breast high-grade DCIS with calcifications and comedonecrosis ER PR Negative Right Lumpectomy: DCIS 1.3 cm, Er 0%, PR 0% with calcifications  Pathology review: Discussed the final path and provided patient with a copy of the path report  Recommendation:  Adjuvant XRT

## 2014-06-01 ENCOUNTER — Telehealth: Payer: Self-pay | Admitting: Hematology and Oncology

## 2014-06-01 ENCOUNTER — Ambulatory Visit (HOSPITAL_BASED_OUTPATIENT_CLINIC_OR_DEPARTMENT_OTHER): Payer: Medicare Other | Admitting: Hematology and Oncology

## 2014-06-01 VITALS — BP 138/73 | HR 70 | Temp 97.9°F | Resp 18 | Ht 64.0 in | Wt 193.0 lb

## 2014-06-01 DIAGNOSIS — C50411 Malignant neoplasm of upper-outer quadrant of right female breast: Secondary | ICD-10-CM | POA: Diagnosis not present

## 2014-06-01 DIAGNOSIS — Z171 Estrogen receptor negative status [ER-]: Secondary | ICD-10-CM | POA: Diagnosis not present

## 2014-06-01 NOTE — Progress Notes (Signed)
Patient Care Team: Crist Infante, MD as PCP - General (Internal Medicine) Rolm Bookbinder, MD as Consulting Physician (General Surgery) Nicholas Lose, MD as Consulting Physician (Hematology and Oncology) Kyung Rudd, MD as Consulting Physician (Radiation Oncology) Rockwell Germany, RN as Registered Nurse Mauro Kaufmann, RN as Registered Nurse Holley Bouche, NP as Nurse Practitioner (Nurse Practitioner)  DIAGNOSIS: Breast cancer of upper-outer quadrant of right female breast   Staging form: Breast, AJCC 7th Edition     Clinical stage from 04/19/2014: Stage 0 (Tis (DCIS), N0, M0) - Unsigned       Staging comments: Staged at breast conference on 04/19/14    SUMMARY OF ONCOLOGIC HISTORY:   Breast cancer of upper-outer quadrant of right female breast   04/19/2014 Initial Diagnosis Breast cancer of upper-outer quadrant of right female breast   05/18/2014 Surgery Right Lumpectomy: DCIS 1.3 cm, Er 0%, PR 0% with calcifications    CHIEF COMPLIANT: Follow-up after lumpectomy  INTERVAL HISTORY: Sydney Wood is a 79 year old with above-mentioned history of right-sided breast cancer treated with lumpectomy and is here today to discuss the pathology report. She has done extremely well from the surgery standpoint and denies any major discomfort. She is in excellent physical health and good performance status.  REVIEW OF SYSTEMS:   Constitutional: Denies fevers, chills or abnormal weight loss Eyes: Denies blurriness of vision Ears, nose, mouth, throat, and face: Denies mucositis or sore throat Respiratory: Denies cough, dyspnea or wheezes Cardiovascular: Denies palpitation, chest discomfort or lower extremity swelling Gastrointestinal:  Denies nausea, heartburn or change in bowel habits Skin: Denies abnormal skin rashes Lymphatics: Denies new lymphadenopathy or easy bruising Neurological:Denies numbness, tingling or new weaknesses Behavioral/Psych: Mood is stable, no new changes  Breast:   denies any pain or lumps or nodules in either breasts All other systems were reviewed with the patient and are negative.  I have reviewed the past medical history, past surgical history, social history and family history with the patient and they are unchanged from previous note.  ALLERGIES:  is allergic to latex; shellfish-derived products; and simvastatin-high dose.  MEDICATIONS:  Current Outpatient Prescriptions  Medication Sig Dispense Refill  . aspirin 81 MG tablet Take 81 mg by mouth daily.    Marland Kitchen atorvastatin (LIPITOR) 10 MG tablet Take 10 mg by mouth daily.  1  . Calcium Carbonate-Vitamin D (CALTRATE 600+D PO) Take by mouth daily.      . cholecalciferol (VITAMIN D) 400 UNITS TABS Take 400 Units by mouth daily.      Marland Kitchen doxepin (SINEQUAN) 25 MG capsule Take 25 mg by mouth daily.  0  . HYDROcodone-acetaminophen (NORCO) 10-325 MG per tablet Take 1 tablet by mouth every 6 (six) hours as needed. 20 tablet 0  . ibuprofen (ADVIL,MOTRIN) 100 MG tablet Take 100 mg by mouth as needed for fever.    . Levothyroxine Sodium (SYNTHROID PO) Take 150 mcg by mouth daily.     . Magnesium 200 MG TABS Take 400 mg by mouth daily.     . Multiple Vitamin (MULTIVITAMIN) tablet Take 1 tablet by mouth daily.      Marland Kitchen omeprazole (PRILOSEC) 20 MG capsule Take 20 mg by mouth daily.     . temazepam (RESTORIL) 15 MG capsule Take 15 mg by mouth at bedtime as needed for sleep.    . vitamin B-12 (CYANOCOBALAMIN) 1000 MCG tablet Take 1,000 mcg by mouth daily.     No current facility-administered medications for this visit.    PHYSICAL EXAMINATION:  ECOG PERFORMANCE STATUS: 0 - Asymptomatic  Filed Vitals:   06/01/14 1015  BP: 138/73  Pulse: 70  Temp: 97.9 F (36.6 C)  Resp: 18   Filed Weights   06/01/14 1015  Weight: 193 lb (87.544 kg)    GENERAL:alert, no distress and comfortable SKIN: skin color, texture, turgor are normal, no rashes or significant lesions EYES: normal, Conjunctiva are pink and  non-injected, sclera clear OROPHARYNX:no exudate, no erythema and lips, buccal mucosa, and tongue normal  NECK: supple, thyroid normal size, non-tender, without nodularity LYMPH:  no palpable lymphadenopathy in the cervical, axillary or inguinal LUNGS: clear to auscultation and percussion with normal breathing effort HEART: regular rate & rhythm and no murmurs and no lower extremity edema ABDOMEN:abdomen soft, non-tender and normal bowel sounds Musculoskeletal:no cyanosis of digits and no clubbing  NEURO: alert & oriented x 3 with fluent speech, no focal motor/sensory deficits  LABORATORY DATA:  I have reviewed the data as listed   Chemistry      Component Value Date/Time   NA 140 04/19/2014 1204   K 4.6 04/19/2014 1204   CO2 25 04/19/2014 1204   BUN 15.2 04/19/2014 1204   CREATININE 0.8 04/19/2014 1204      Component Value Date/Time   CALCIUM 9.5 04/19/2014 1204   ALKPHOS 96 04/19/2014 1204   ALKPHOS 84 12/16/2013 0830   AST 19 04/19/2014 1204   AST 21 12/16/2013 0830   ALT 17 04/19/2014 1204   ALT 16 12/16/2013 0830   BILITOT 0.44 04/19/2014 1204   BILITOT 0.6 12/16/2013 0830       Lab Results  Component Value Date   WBC 5.8 04/19/2014   HGB 12.8 04/19/2014   HCT 39.5 04/19/2014   MCV 87.6 04/19/2014   PLT 198 04/19/2014   NEUTROABS 4.1 04/19/2014    ASSESSMENT & PLAN:  Breast cancer of upper-outer quadrant of right female breast Right breast high-grade DCIS with calcifications and comedonecrosis ER PR Negative Right Lumpectomy: DCIS 1.3 cm, Er 0%, PR 0% with calcifications  Pathology review: Discussed the final path and provided patient with a copy of the path report  Recommendation:  Adjuvant XRT  Return to clinic in 1 year for follow-up and breast exams. Since the patient be followed very closely by surgery and radiation oncology we decided to see her in one year.  No orders of the defined types were placed in this encounter.   The patient has a good  understanding of the overall plan. she agrees with it. she will call with any problems that may develop before the next visit here.   Rulon Eisenmenger, MD

## 2014-06-01 NOTE — Telephone Encounter (Signed)
Appointments made and avs printed for patient °

## 2014-06-02 NOTE — Progress Notes (Signed)
Location of Breast Cancer:   Right Breast Upper Outer Quadrant  Histology per Pathology Report: 04/19/14 Intial Diagnosis Breast cancer of upper -outer quadrant right breast,requested path  Receptor Status: ER( neg 0%  ), PR ( neg   0%), Her2-neu (   )  Did patient present with symptoms (if so, please note symptoms) or was this found on screening mammography?: found on mammogram screening  Past/Anticipated interventions by surgeon, if PND:LOPRAFOAD 05/18/14 : Breast, lumpectomy, Right - DUCTAL CARCINOMA IN SITU, SEE COMMENT.- IN SITU CARCINOMA IS 1 MM FROM THE NEAREST MARGIN (POSTERIOR).- CALCIFICATIONS PRESENT.- PREVIOUS BIOPSY SITE. Dr. Darleen Crocker ,, last seen 06/02/14 will f/u  6 months  Past/Anticipated interventions by medical oncology, if any: Chemotherapy : Dr. Nicholas Lose.,MD seen 06/01/14 will f/u 1 year  Lymphedema issues, if any:  none  Pain issues, if any:  none  SAFETY ISSUES:  Prior radiation? NO  Pacemaker/ICD? NO  Possible current pregnancy? N/A  Is the patient on methotrexate?No  Current Complaints / other details, Gx4 P2, menarache age 89, menopause [age],  Sleep apnea,Thyroid disease Allergies: Latex,shellfish-derived products,& simvastatin high dose BP 134/75 mmHg  Pulse 78  Temp(Src) 98.8 F (37.1 C) (Oral)  Resp 20  Ht _0  (1.626 m)  Wt 193 lb 9.6 oz (87.816 kg)  BMI 33.21 kg/m2  SpO2 100%  Wt Readings from Last 3 Encounters:  06/05/14 193 lb 9.6 oz (87.816 kg)  06/01/14 193 lb (87.544 kg)  05/18/14 193 lb (87.544 kg)   Rebecca Eaton, RN 06/02/2014,1:25 PM

## 2014-06-05 ENCOUNTER — Ambulatory Visit
Admission: RE | Admit: 2014-06-05 | Discharge: 2014-06-05 | Disposition: A | Discharge status: Home / Self Care | Payer: Medicare Other | Source: Ambulatory Visit | Attending: Radiation Oncology | Admitting: Radiation Oncology

## 2014-06-05 ENCOUNTER — Encounter: Payer: Self-pay | Admitting: Radiation Oncology

## 2014-06-05 VITALS — BP 134/75 | HR 78 | Temp 98.8°F | Resp 20 | Ht 64.0 in | Wt 193.6 lb

## 2014-06-05 DIAGNOSIS — Z171 Estrogen receptor negative status [ER-]: Secondary | ICD-10-CM | POA: Diagnosis not present

## 2014-06-05 DIAGNOSIS — L599 Disorder of the skin and subcutaneous tissue related to radiation, unspecified: Secondary | ICD-10-CM | POA: Insufficient documentation

## 2014-06-05 DIAGNOSIS — Z510 Encounter for antineoplastic radiation therapy: Secondary | ICD-10-CM | POA: Insufficient documentation

## 2014-06-05 DIAGNOSIS — C50411 Malignant neoplasm of upper-outer quadrant of right female breast: Secondary | ICD-10-CM

## 2014-06-05 NOTE — Progress Notes (Signed)
Please see the Nurse Progress Note in the MD Initial Consult Encounter for this patient. 

## 2014-06-05 NOTE — Progress Notes (Signed)
Radiation Oncology         (336) [phone] ________________________________  Name: Sydney Wood MRN: [id number]  Date: 06/05/2014  DOB: 1935-10-29  Follow-Up Visit Note  CC: Jerlyn Ly, MD  Rolm Bookbinder, MD  Diagnosis:    Breast cancer of upper-outer quadrant of right female breast   Staging form: Breast, AJCC 7th Edition     Clinical stage from 04/19/2014: Stage 0 (Tis (DCIS), N0, M0) - Unsigned       Staging comments: Staged at breast conference on 04/19/14  Histology per Pathology Report: 04/19/14 Intial Diagnosis Breast cancer of upper -outer quadrant right breast,requested path  Receptor Status: ER( neg 0% ), PR ( neg 0%), Her2-neu ( )  Did patient present with symptoms (if so, please note symptoms) or was this found on screening mammography?: found on mammogram screening  Past/Anticipated interventions by surgeon, if KGU:[id number] 05/18/14 : Breast, lumpectomy, Right  - DUCTAL CARCINOMA IN SITU, SEE COMMENT.- IN SITU CARCINOMA IS 1 MM FROM THE NEAREST MARGIN (POSTERIOR).- CALCIFICATIONS PRESENT.- PREVIOUS BIOPSY SITE.  Narrative:  The patient returns today for follow-up.  She has completed lumpectomy and now presents for further discussion and coordination of an anticipated course of radiation treatment. The patient has seen medical oncology. She will not receive chemotherapy. The patient will not be receiving anti-hormonal treatment after radiation treatment. Past/Anticipated interventions by medical oncology, if any: Chemotherapy : Dr. Nicholas Lose.,MD seen 06/01/14 will f/u 1 year  Lymphedema issues, if any: none Pain issues, if any: none  The patient states that she is healing well and will see Dr. [name] for a follow up in 6 months. The pt has no complaint at this time.  ALLERGIES:  is allergic to latex; shellfish-derived products; and simvastatin-high dose.  Meds: Current Outpatient Prescriptions  Medication Sig Dispense Refill  . aspirin 81 MG tablet Take 81 mg  by mouth daily.    Marland Kitchen atorvastatin (LIPITOR) 10 MG tablet Take 10 mg by mouth daily.  1  . Calcium Carbonate-Vitamin D (CALTRATE 600+D PO) Take by mouth daily.      . cholecalciferol (VITAMIN D) 400 UNITS TABS Take 400 Units by mouth daily.      Marland Kitchen doxepin (SINEQUAN) 25 MG capsule Take 25 mg by mouth daily.  0  . ibuprofen (ADVIL,MOTRIN) 100 MG tablet Take 100 mg by mouth as needed for fever.    . Levothyroxine Sodium (SYNTHROID PO) Take 150 mcg by mouth daily.     . Magnesium 200 MG TABS Take 400 mg by mouth daily.     . Multiple Vitamin (MULTIVITAMIN) tablet Take 1 tablet by mouth daily.      Marland Kitchen omeprazole (PRILOSEC) 20 MG capsule Take 20 mg by mouth daily.     . temazepam (RESTORIL) 15 MG capsule Take 15 mg by mouth at bedtime as needed for sleep.    . vitamin B-12 (CYANOCOBALAMIN) 1000 MCG tablet Take 1,000 mcg by mouth daily.    Marland Kitchen HYDROcodone-acetaminophen (NORCO) 10-325 MG per tablet Take 1 tablet by mouth every 6 (six) hours as needed. (Patient not taking: Reported on 06/05/2014) 20 tablet 0   No current facility-administered medications for this encounter.    Physical Findings: The patient is in no acute distress. Patient is alert and oriented.  height is _0  (1.626 m) and weight is 193 lb 9.6 oz (87.816 kg). Her oral temperature is 98.8 F (37.1 C). Her blood pressure is 134/75 and her pulse is 78. Her respiration is 20 and oxygen saturation  is 100%.   Surgical incision healing well; no sign of infection.  Lab Findings: Lab Results  Component Value Date   WBC 5.8 04/19/2014   HGB 12.8 04/19/2014   HCT 39.5 04/19/2014   MCV 87.6 04/19/2014   PLT 198 04/19/2014     Radiographic Findings: No results found.  Impression:     Breast cancer of upper-outer quadrant of right female breast   Staging form: Breast, AJCC 7th Edition     Clinical stage from 04/19/2014: Stage 0 (Tis (DCIS), N0, M0) - Unsigned       Staging comments: Staged at breast conference on 04/19/14    The  patient has completed surgery. She is suitable to proceed with adjuvant radiotherapy at this time.  I once again discussed the rationale for radiation treatment in this setting. We discussed the potential benefit of radiation treatment in terms of local/regional control. We also discussed the possible side effects and risks of a possible course of radiation treatment. All of her questions were answered.  The patient does wish to proceed with radiation treatment.  Plan:  The patient will be scheduled for a simulation in the near future such that we can proceed with radiation treatment planning. I anticipate treating the patient for approximately 4 weeks to the right breast using tangent fields. The patient would not be available June 18-25, therefore the radiation treatment needs to be completed before then. Schedule CT simulation later this week and hope to start radiation the Wednesday (5/18) of next week.   This document serves as a record of services personally performed by Kyung Rudd, MD. It was created on his behalf by Darcus Austin, a trained medical scribe. The creation of this record is based on the scribe's personal observations and the provider's statements to them. This document has been checked and approved by the attending provider.    ------------------------------------------------  Jodelle Gross, MD, PhD

## 2014-06-06 ENCOUNTER — Encounter: Payer: Self-pay | Admitting: Radiation Oncology

## 2014-06-08 ENCOUNTER — Ambulatory Visit
Admission: RE | Admit: 2014-06-08 | Discharge: 2014-06-08 | Disposition: A | Discharge status: Home / Self Care | Payer: Medicare Other | Source: Ambulatory Visit | Attending: Radiation Oncology | Admitting: Radiation Oncology

## 2014-06-08 DIAGNOSIS — C50411 Malignant neoplasm of upper-outer quadrant of right female breast: Secondary | ICD-10-CM

## 2014-06-08 DIAGNOSIS — Z510 Encounter for antineoplastic radiation therapy: Secondary | ICD-10-CM | POA: Diagnosis not present

## 2014-06-09 NOTE — Progress Notes (Signed)
  Radiation Oncology         (336) [phone] ________________________________  Name: Sydney Wood MRN: [id number]  Date: 06/08/2014  DOB: [birth year]  SIMULATION AND TREATMENT PLANNING NOTE  The patient presented for simulation prior to beginning her course of radiation treatment for her diagnosis of right-sided breast cancer. The patient was placed in a supine position on a breast board. A customized vac-lock bag was constructed and this complex treatment device will be used on a daily basis during her treatment. In this fashion, a CT scan was obtained through the chest area and an isocenter was placed near the chest wall within the breast.  The patient will be planned to receive a course of radiation initially to a dose of 42.5 Gy. This will consist of a whole breast radiotherapy technique. To accomplish this, 2 customized blocks have been designed which will correspond to medial and lateral whole breast tangent fields. This treatment will be accomplished at 2.5 Gy per fraction. A forward planning technique will also be evaluated to determine if this approach improves the plan. It is anticipated that the patient will then receive a 7.5 Gy boost to the seroma cavity which has been contoured. This will be accomplished at 2.5 Gy per fraction.   This initial treatment will consist of a 3-D conformal technique. The seroma has been contoured as the primary target structure. Additionally, dose volume histograms of both this target as well as the lungs and heart will also be evaluated. Such an approach is necessary to ensure that the target area is adequately covered while the nearby critical  normal structures are adequately spared.  Plan:  The final anticipated total dose therefore will correspond to 50 Gy.    _______________________________   Jodelle Gross, MD, PhD

## 2014-06-12 DIAGNOSIS — Z510 Encounter for antineoplastic radiation therapy: Secondary | ICD-10-CM | POA: Diagnosis not present

## 2014-06-13 ENCOUNTER — Ambulatory Visit
Admission: RE | Admit: 2014-06-13 | Discharge: 2014-06-13 | Disposition: A | Discharge status: Home / Self Care | Payer: Medicare Other | Source: Ambulatory Visit | Attending: Radiation Oncology | Admitting: Radiation Oncology

## 2014-06-13 ENCOUNTER — Telehealth: Payer: Self-pay | Admitting: *Deleted

## 2014-06-13 ENCOUNTER — Ambulatory Visit: Admission: RE | Admit: 2014-06-13 | Payer: Medicare Other | Source: Ambulatory Visit

## 2014-06-13 DIAGNOSIS — C50411 Malignant neoplasm of upper-outer quadrant of right female breast: Secondary | ICD-10-CM

## 2014-06-13 DIAGNOSIS — Z510 Encounter for antineoplastic radiation therapy: Secondary | ICD-10-CM | POA: Diagnosis not present

## 2014-06-13 MED ORDER — RADIAPLEXRX EX GEL
Freq: Once | CUTANEOUS | Status: AC
  Administered 2014-06-13: 12:00:00 via TOPICAL

## 2014-06-13 MED ORDER — ALRA NON-METALLIC DEODORANT (RAD-ONC)
1.0000 "application " | Freq: Once | TOPICAL | Status: AC
  Administered 2014-06-13: 1 via TOPICAL

## 2014-06-13 NOTE — Progress Notes (Signed)
Patient education done, discussed ways to manage side effects,/symptoms,: pain, skin irritation, fatigue, radiation therapy and you book,alra deodorant,radiaplex gel, my business card given to patient, no skin products on area being tretaed with radiation 4 hours prior, apply radiaplex gel after rad txs and bedtime daily, alra prn after rad txs, sees MD weekly and prn after rad txs, increas protein in diet,stay hydrated,drink plenty water, teach back given 1:00 PM

## 2014-06-14 ENCOUNTER — Ambulatory Visit
Admission: RE | Admit: 2014-06-14 | Discharge: 2014-06-14 | Disposition: A | Discharge status: Home / Self Care | Payer: Medicare Other | Source: Ambulatory Visit | Attending: Radiation Oncology | Admitting: Radiation Oncology

## 2014-06-14 DIAGNOSIS — Z510 Encounter for antineoplastic radiation therapy: Secondary | ICD-10-CM | POA: Diagnosis not present

## 2014-06-14 NOTE — Telephone Encounter (Signed)
error 

## 2014-06-15 ENCOUNTER — Ambulatory Visit
Admission: RE | Admit: 2014-06-15 | Discharge: 2014-06-15 | Disposition: A | Discharge status: Home / Self Care | Payer: Medicare Other | Source: Ambulatory Visit | Attending: Radiation Oncology | Admitting: Radiation Oncology

## 2014-06-15 DIAGNOSIS — Z510 Encounter for antineoplastic radiation therapy: Secondary | ICD-10-CM | POA: Diagnosis not present

## 2014-06-16 ENCOUNTER — Ambulatory Visit
Admission: RE | Admit: 2014-06-16 | Discharge: 2014-06-16 | Disposition: A | Discharge status: Home / Self Care | Payer: Medicare Other | Source: Ambulatory Visit | Attending: Radiation Oncology | Admitting: Radiation Oncology

## 2014-06-16 ENCOUNTER — Encounter: Payer: Self-pay | Admitting: Radiation Oncology

## 2014-06-16 VITALS — BP 167/100 | HR 91 | Resp 16 | Wt 189.9 lb

## 2014-06-16 DIAGNOSIS — C50411 Malignant neoplasm of upper-outer quadrant of right female breast: Secondary | ICD-10-CM

## 2014-06-16 DIAGNOSIS — Z510 Encounter for antineoplastic radiation therapy: Secondary | ICD-10-CM | POA: Diagnosis not present

## 2014-06-16 NOTE — Progress Notes (Signed)
   Department of Radiation Oncology  Phone:  [phone] Fax:        [phone]  Weekly Treatment Note    Name: Sydney Wood Date: 06/16/2014 MRN: [id number] DOB: [birth year]   Current dose: 7.5 Gy  Current fraction: 3   MEDICATIONS: Current Outpatient Prescriptions  Medication Sig Dispense Refill  . aspirin 81 MG tablet Take 81 mg by mouth daily.    Marland Kitchen atorvastatin (LIPITOR) 10 MG tablet Take 10 mg by mouth daily.  1  . Calcium Carbonate-Vitamin D (CALTRATE 600+D PO) Take by mouth daily.      . cholecalciferol (VITAMIN D) 400 UNITS TABS Take 400 Units by mouth daily.      Marland Kitchen doxepin (SINEQUAN) 25 MG capsule Take 25 mg by mouth daily.  0  . hyaluronate sodium (RADIAPLEXRX) GEL Apply 1 application topically 2 (two) times daily.    Marland Kitchen HYDROcodone-acetaminophen (NORCO) 10-325 MG per tablet Take 1 tablet by mouth every 6 (six) hours as needed. (Patient not taking: Reported on 06/05/2014) 20 tablet 0  . ibuprofen (ADVIL,MOTRIN) 100 MG tablet Take 100 mg by mouth as needed for fever.    . Levothyroxine Sodium (SYNTHROID PO) Take 150 mcg by mouth daily.     . Magnesium 200 MG TABS Take 400 mg by mouth daily.     . Multiple Vitamin (MULTIVITAMIN) tablet Take 1 tablet by mouth daily.      . non-metallic deodorant Jethro Poling) MISC Apply 1 application topically daily as needed.    Marland Kitchen omeprazole (PRILOSEC) 20 MG capsule Take 20 mg by mouth daily.     . temazepam (RESTORIL) 15 MG capsule Take 15 mg by mouth at bedtime as needed for sleep.    . vitamin B-12 (CYANOCOBALAMIN) 1000 MCG tablet Take 1,000 mcg by mouth daily.     No current facility-administered medications for this encounter.     ALLERGIES: Latex; Shellfish-derived products; and Simvastatin-high dose   LABORATORY DATA:  Lab Results  Component Value Date   WBC 5.8 04/19/2014   HGB 12.8 04/19/2014   HCT 39.5 04/19/2014   MCV 87.6 04/19/2014   PLT 198 04/19/2014   Lab Results  Component Value Date   NA 140 04/19/2014   K  4.6 04/19/2014   CO2 25 04/19/2014   Lab Results  Component Value Date   ALT 17 04/19/2014   AST 19 04/19/2014   ALKPHOS 96 04/19/2014   BILITOT 0.44 04/19/2014     NARRATIVE: Sydney ZAMOR was seen today for weekly treatment management. The chart was checked and the patient's films were reviewed.  Weight and vitals stable. Denies pain. Denies hyperpigmentation of right/treated breast. Reports using radiaplex and alra as directed. Denies fatigue. Patient without complaints.  PHYSICAL EXAMINATION: weight is 189 lb 14.4 oz (86.138 kg). Her blood pressure is 167/100 and her pulse is 91. Her respiration is 16.        ASSESSMENT: The patient is doing satisfactorily with treatment.  PLAN: We will continue with the patient's radiation treatment as planned.

## 2014-06-16 NOTE — Progress Notes (Signed)
Weight and vitals stable. Denies pain. Denies hyperpigmentation of right/treated breast. Reports using radiaplex and alra as directed. Denies fatigue. Patient without complaints.

## 2014-06-19 ENCOUNTER — Ambulatory Visit
Admission: RE | Admit: 2014-06-19 | Discharge: 2014-06-19 | Disposition: A | Discharge status: Home / Self Care | Payer: Medicare Other | Source: Ambulatory Visit | Attending: Radiation Oncology | Admitting: Radiation Oncology

## 2014-06-19 DIAGNOSIS — Z510 Encounter for antineoplastic radiation therapy: Secondary | ICD-10-CM | POA: Diagnosis not present

## 2014-06-20 ENCOUNTER — Ambulatory Visit
Admission: RE | Admit: 2014-06-20 | Discharge: 2014-06-20 | Disposition: A | Discharge status: Home / Self Care | Payer: Medicare Other | Source: Ambulatory Visit | Attending: Radiation Oncology | Admitting: Radiation Oncology

## 2014-06-20 DIAGNOSIS — Z510 Encounter for antineoplastic radiation therapy: Secondary | ICD-10-CM | POA: Diagnosis not present

## 2014-06-21 ENCOUNTER — Ambulatory Visit
Admission: RE | Admit: 2014-06-21 | Discharge: 2014-06-21 | Disposition: A | Discharge status: Home / Self Care | Payer: Medicare Other | Source: Ambulatory Visit | Attending: Radiation Oncology | Admitting: Radiation Oncology

## 2014-06-21 DIAGNOSIS — Z510 Encounter for antineoplastic radiation therapy: Secondary | ICD-10-CM | POA: Diagnosis not present

## 2014-06-21 NOTE — Progress Notes (Signed)
   Department of Radiation Oncology  Phone:  [phone] Fax:        [phone]  Weekly Treatment Note    Name: Sydney Wood Date: 06/21/2014 MRN: [id number] DOB: 03-Jun-1935   Current dose: 20 Gy  Current fraction: 8   MEDICATIONS: Current Outpatient Prescriptions  Medication Sig Dispense Refill  . aspirin 81 MG tablet Take 81 mg by mouth daily.    Marland Kitchen atorvastatin (LIPITOR) 10 MG tablet Take 10 mg by mouth daily.  1  . Calcium Carbonate-Vitamin D (CALTRATE 600+D PO) Take by mouth daily.      . cholecalciferol (VITAMIN D) 400 UNITS TABS Take 400 Units by mouth daily.      Marland Kitchen doxepin (SINEQUAN) 25 MG capsule Take 25 mg by mouth daily.  0  . hyaluronate sodium (RADIAPLEXRX) GEL Apply 1 application topically 2 (two) times daily.    Marland Kitchen HYDROcodone-acetaminophen (NORCO) 10-325 MG per tablet Take 1 tablet by mouth every 6 (six) hours as needed. (Patient not taking: Reported on 06/05/2014) 20 tablet 0  . ibuprofen (ADVIL,MOTRIN) 100 MG tablet Take 100 mg by mouth as needed for fever.    . Levothyroxine Sodium (SYNTHROID PO) Take 150 mcg by mouth daily.     . Magnesium 200 MG TABS Take 400 mg by mouth daily.     . Multiple Vitamin (MULTIVITAMIN) tablet Take 1 tablet by mouth daily.      . non-metallic deodorant Jethro Poling) MISC Apply 1 application topically daily as needed.    Marland Kitchen omeprazole (PRILOSEC) 20 MG capsule Take 20 mg by mouth daily.     . temazepam (RESTORIL) 15 MG capsule Take 15 mg by mouth at bedtime as needed for sleep.    . vitamin B-12 (CYANOCOBALAMIN) 1000 MCG tablet Take 1,000 mcg by mouth daily.     No current facility-administered medications for this encounter.     ALLERGIES: Latex; Shellfish-derived products; and Simvastatin-high dose   LABORATORY DATA:  Lab Results  Component Value Date   WBC 5.8 04/19/2014   HGB 12.8 04/19/2014   HCT 39.5 04/19/2014   MCV 87.6 04/19/2014   PLT 198 04/19/2014   Lab Results  Component Value Date   NA 140 04/19/2014   K  4.6 04/19/2014   CO2 25 04/19/2014   Lab Results  Component Value Date   ALT 17 04/19/2014   AST 19 04/19/2014   ALKPHOS 96 04/19/2014   BILITOT 0.44 04/19/2014     NARRATIVE: Sydney Wood was seen today for weekly treatment management. The chart was checked and the patient's films were reviewed.  The patient is doing very well. Her right eye is much improved. She has not noticed any changes in her scan.  PHYSICAL EXAMINATION: vitals were not taken for this visit.     mild erythema in the treatment area. No desquamation.  ASSESSMENT: The patient is doing satisfactorily with treatment.  PLAN: We will continue with the patient's radiation treatment as planned.

## 2014-06-22 ENCOUNTER — Ambulatory Visit
Admission: RE | Admit: 2014-06-22 | Discharge: 2014-06-22 | Disposition: A | Discharge status: Home / Self Care | Payer: Medicare Other | Source: Ambulatory Visit | Attending: Radiation Oncology | Admitting: Radiation Oncology

## 2014-06-22 DIAGNOSIS — Z510 Encounter for antineoplastic radiation therapy: Secondary | ICD-10-CM | POA: Diagnosis not present

## 2014-06-23 ENCOUNTER — Ambulatory Visit
Admission: RE | Admit: 2014-06-23 | Discharge: 2014-06-23 | Disposition: A | Discharge status: Home / Self Care | Payer: Medicare Other | Source: Ambulatory Visit | Attending: Radiation Oncology | Admitting: Radiation Oncology

## 2014-06-23 ENCOUNTER — Encounter: Payer: Self-pay | Admitting: Radiation Oncology

## 2014-06-23 VITALS — BP 142/82 | HR 76 | Temp 98.4°F | Wt 193.9 lb

## 2014-06-23 DIAGNOSIS — C50411 Malignant neoplasm of upper-outer quadrant of right female breast: Secondary | ICD-10-CM

## 2014-06-23 DIAGNOSIS — Z510 Encounter for antineoplastic radiation therapy: Secondary | ICD-10-CM | POA: Diagnosis not present

## 2014-06-23 NOTE — Progress Notes (Signed)
Weekly Assessment of radiation to right breast.Treatment 8 of 20.Denies pain.Skin is pink.No concerns voiced.Continue application of radiaplex twice daily.

## 2014-06-27 ENCOUNTER — Telehealth: Payer: Self-pay | Admitting: *Deleted

## 2014-06-27 ENCOUNTER — Ambulatory Visit
Admission: RE | Admit: 2014-06-27 | Discharge: 2014-06-27 | Disposition: A | Discharge status: Home / Self Care | Payer: Medicare Other | Source: Ambulatory Visit | Attending: Radiation Oncology | Admitting: Radiation Oncology

## 2014-06-27 DIAGNOSIS — Z510 Encounter for antineoplastic radiation therapy: Secondary | ICD-10-CM | POA: Diagnosis not present

## 2014-06-27 NOTE — Telephone Encounter (Signed)
Pt returned call. Denies needs or questions at this time. Relate doing well and without complaints. Encourage pt to call with concerns. Received verbal understanding.

## 2014-06-27 NOTE — Telephone Encounter (Signed)
Left msg for pt to return call to assess needs during xrt. Contact information given.

## 2014-06-28 ENCOUNTER — Ambulatory Visit
Admission: RE | Admit: 2014-06-28 | Discharge: 2014-06-28 | Disposition: A | Discharge status: Home / Self Care | Payer: Medicare Other | Source: Ambulatory Visit | Attending: Radiation Oncology | Admitting: Radiation Oncology

## 2014-06-28 DIAGNOSIS — Z510 Encounter for antineoplastic radiation therapy: Secondary | ICD-10-CM | POA: Diagnosis not present

## 2014-06-29 ENCOUNTER — Ambulatory Visit
Admission: RE | Admit: 2014-06-29 | Discharge: 2014-06-29 | Disposition: A | Discharge status: Home / Self Care | Payer: Medicare Other | Source: Ambulatory Visit | Attending: Radiation Oncology | Admitting: Radiation Oncology

## 2014-06-29 DIAGNOSIS — Z510 Encounter for antineoplastic radiation therapy: Secondary | ICD-10-CM | POA: Diagnosis not present

## 2014-06-30 ENCOUNTER — Ambulatory Visit
Admission: RE | Admit: 2014-06-30 | Discharge: 2014-06-30 | Disposition: A | Discharge status: Home / Self Care | Payer: Medicare Other | Source: Ambulatory Visit | Attending: Radiation Oncology | Admitting: Radiation Oncology

## 2014-06-30 VITALS — BP 127/71 | HR 70 | Temp 98.2°F | Wt 193.0 lb

## 2014-06-30 DIAGNOSIS — Z510 Encounter for antineoplastic radiation therapy: Secondary | ICD-10-CM | POA: Diagnosis not present

## 2014-06-30 DIAGNOSIS — C50411 Malignant neoplasm of upper-outer quadrant of right female breast: Secondary | ICD-10-CM

## 2014-06-30 NOTE — Progress Notes (Signed)
   Department of Radiation Oncology  Phone:  [phone] Fax:        [phone]  Weekly Treatment Note    Name: Sydney Wood Date: 06/30/2014 MRN: [id number] DOB: 02-18-1935   Current dose: 30 Gy  Current fraction: 12   MEDICATIONS: Current Outpatient Prescriptions  Medication Sig Dispense Refill  . aspirin 81 MG tablet Take 81 mg by mouth daily.    Marland Kitchen atorvastatin (LIPITOR) 10 MG tablet Take 10 mg by mouth daily.  1  . Calcium Carbonate-Vitamin D (CALTRATE 600+D PO) Take by mouth daily.      . cholecalciferol (VITAMIN D) 400 UNITS TABS Take 400 Units by mouth daily.      Marland Kitchen doxepin (SINEQUAN) 25 MG capsule Take 25 mg by mouth daily.  0  . hyaluronate sodium (RADIAPLEXRX) GEL Apply 1 application topically 2 (two) times daily.    Marland Kitchen HYDROcodone-acetaminophen (NORCO) 10-325 MG per tablet Take 1 tablet by mouth every 6 (six) hours as needed. 20 tablet 0  . ibuprofen (ADVIL,MOTRIN) 100 MG tablet Take 100 mg by mouth as needed for fever.    . Levothyroxine Sodium (SYNTHROID PO) Take 150 mcg by mouth daily.     . Magnesium 200 MG TABS Take 400 mg by mouth daily.     . Multiple Vitamin (MULTIVITAMIN) tablet Take 1 tablet by mouth daily.      . non-metallic deodorant Jethro Poling) MISC Apply 1 application topically daily as needed.    Marland Kitchen omeprazole (PRILOSEC) 20 MG capsule Take 20 mg by mouth daily.     . temazepam (RESTORIL) 15 MG capsule Take 15 mg by mouth at bedtime as needed for sleep.    . vitamin B-12 (CYANOCOBALAMIN) 1000 MCG tablet Take 1,000 mcg by mouth daily.     No current facility-administered medications for this encounter.     ALLERGIES: Latex; Shellfish-derived products; and Simvastatin-high dose   LABORATORY DATA:  Lab Results  Component Value Date   WBC 5.8 04/19/2014   HGB 12.8 04/19/2014   HCT 39.5 04/19/2014   MCV 87.6 04/19/2014   PLT 198 04/19/2014   Lab Results  Component Value Date   NA 140 04/19/2014   K 4.6 04/19/2014   CO2 25 04/19/2014   Lab  Results  Component Value Date   ALT 17 04/19/2014   AST 19 04/19/2014   ALKPHOS 96 04/19/2014   BILITOT 0.44 04/19/2014     NARRATIVE: Sydney Wood was seen today for weekly treatment management. The chart was checked and the patient's films were reviewed.  Weekly assessment of radiation to right breast.Completed 12 of 20 fractions.Skin is pink around areola otherwise ok.Denies pain or fatigue.Continue application of radiaplex twice daily.  PHYSICAL EXAMINATION: weight is 193 lb (87.544 kg). Her temperature is 98.2 F (36.8 C). Her blood pressure is 127/71 and her pulse is 70.      some mild erythema present in the treatment area  ASSESSMENT: The patient is doing satisfactorily with treatment.  PLAN: We will continue with the patient's radiation treatment as planned.

## 2014-06-30 NOTE — Progress Notes (Signed)
Weekly assessment of radiation to right breast.Completed 12 of 20 fractions.Skin is pink around areola otherwise ok.Denies pain or fatigue.Continue application of radiaplex twice daily.

## 2014-07-03 ENCOUNTER — Ambulatory Visit
Admission: RE | Admit: 2014-07-03 | Discharge: 2014-07-03 | Disposition: A | Discharge status: Home / Self Care | Payer: Medicare Other | Source: Ambulatory Visit | Attending: Radiation Oncology | Admitting: Radiation Oncology

## 2014-07-03 DIAGNOSIS — Z510 Encounter for antineoplastic radiation therapy: Secondary | ICD-10-CM | POA: Diagnosis not present

## 2014-07-04 ENCOUNTER — Ambulatory Visit
Admission: RE | Admit: 2014-07-04 | Discharge: 2014-07-04 | Disposition: A | Discharge status: Home / Self Care | Payer: Medicare Other | Source: Ambulatory Visit | Attending: Radiation Oncology | Admitting: Radiation Oncology

## 2014-07-04 DIAGNOSIS — Z510 Encounter for antineoplastic radiation therapy: Secondary | ICD-10-CM | POA: Diagnosis not present

## 2014-07-05 ENCOUNTER — Ambulatory Visit
Admission: RE | Admit: 2014-07-05 | Discharge: 2014-07-05 | Disposition: A | Discharge status: Home / Self Care | Payer: Medicare Other | Source: Ambulatory Visit | Attending: Radiation Oncology | Admitting: Radiation Oncology

## 2014-07-05 DIAGNOSIS — Z510 Encounter for antineoplastic radiation therapy: Secondary | ICD-10-CM | POA: Diagnosis not present

## 2014-07-06 ENCOUNTER — Encounter: Payer: Self-pay | Admitting: Radiation Oncology

## 2014-07-06 ENCOUNTER — Ambulatory Visit: Admission: RE | Admit: 2014-07-06 | Payer: Medicare Other | Source: Ambulatory Visit | Admitting: Radiation Oncology

## 2014-07-06 ENCOUNTER — Ambulatory Visit
Admission: RE | Admit: 2014-07-06 | Discharge: 2014-07-06 | Disposition: A | Discharge status: Home / Self Care | Payer: Medicare Other | Source: Ambulatory Visit | Attending: Radiation Oncology | Admitting: Radiation Oncology

## 2014-07-06 VITALS — BP 145/80 | HR 67 | Temp 98.7°F | Resp 20 | Wt 192.9 lb

## 2014-07-06 DIAGNOSIS — C50411 Malignant neoplasm of upper-outer quadrant of right female breast: Secondary | ICD-10-CM

## 2014-07-06 DIAGNOSIS — Z510 Encounter for antineoplastic radiation therapy: Secondary | ICD-10-CM | POA: Diagnosis not present

## 2014-07-06 NOTE — Progress Notes (Signed)
  Radiation Oncology         [phone]   Name: Sydney Wood MRN: [id number]   Date: 07/06/2014  DOB: 26-Apr-1935   Weekly Radiation Therapy Management    ICD-9-CM ICD-10-CM   1. Breast cancer of upper-outer quadrant of right female breast 174.4 C50.411     Current Dose: 40 Gy  Planned Dose:  50 Gy  Narrative The patient presents for routine under treatment assessment. Weekly rad txs rioght breast, mild pink tinge colored, skin intact, using radiaplex bid, occasional twinges  in right breast resolves quickly   The patient is without complaint.  Set-up films were reviewed.  The chart was checked.  Physical Findings BP 145/80 mmHg  Pulse 67  Temp(Src) 98.7 F (37.1 C) (Oral)  Resp 20  Wt 192 lb 14.4 oz (87.499 kg). Weight essentially stable.  No significant changes.  Impression The patient is tolerating radiation.  Plan Continue treatment as planned.    This document serves as a record of services personally performed by Tyler Pita, MD. It was created on his behalf by Jeralene Peters, a trained medical scribe. The creation of this record is based on the scribe's personal observations and the provider's statements to them. This document has been checked and approved by the attending provider.        Sydney Wood, M.D.

## 2014-07-06 NOTE — Progress Notes (Signed)
Weekly rad txs rioght breast, mild pink tinge colored, skin intact, using radiaplex bid, occasional twinges  in right breast resolves quickly 10:28 AM BP 145/80 mmHg  Pulse 67  Temp(Src) 98.7 F (37.1 C) (Oral)  Resp 20  Wt 192 lb 14.4 oz (87.499 kg)  Wt Readings from Last 3 Encounters:  07/06/14 192 lb 14.4 oz (87.499 kg)  06/30/14 193 lb (87.544 kg)  06/30/14 193 lb (87.544 kg)

## 2014-07-07 ENCOUNTER — Ambulatory Visit
Admission: RE | Admit: 2014-07-07 | Discharge: 2014-07-07 | Disposition: A | Discharge status: Home / Self Care | Payer: Medicare Other | Source: Ambulatory Visit | Attending: Radiation Oncology | Admitting: Radiation Oncology

## 2014-07-07 DIAGNOSIS — Z510 Encounter for antineoplastic radiation therapy: Secondary | ICD-10-CM | POA: Diagnosis not present

## 2014-07-10 ENCOUNTER — Ambulatory Visit
Admission: RE | Admit: 2014-07-10 | Discharge: 2014-07-10 | Disposition: A | Discharge status: Home / Self Care | Payer: Medicare Other | Source: Ambulatory Visit | Attending: Radiation Oncology | Admitting: Radiation Oncology

## 2014-07-10 DIAGNOSIS — Z510 Encounter for antineoplastic radiation therapy: Secondary | ICD-10-CM | POA: Diagnosis not present

## 2014-07-11 ENCOUNTER — Ambulatory Visit
Admission: RE | Admit: 2014-07-11 | Discharge: 2014-07-11 | Disposition: A | Discharge status: Home / Self Care | Payer: Medicare Other | Source: Ambulatory Visit | Attending: Radiation Oncology | Admitting: Radiation Oncology

## 2014-07-11 DIAGNOSIS — Z510 Encounter for antineoplastic radiation therapy: Secondary | ICD-10-CM | POA: Diagnosis not present

## 2014-07-12 ENCOUNTER — Ambulatory Visit
Admission: RE | Admit: 2014-07-12 | Discharge: 2014-07-12 | Disposition: A | Discharge status: Home / Self Care | Payer: Medicare Other | Source: Ambulatory Visit | Attending: Radiation Oncology | Admitting: Radiation Oncology

## 2014-07-12 ENCOUNTER — Encounter: Payer: Self-pay | Admitting: Radiation Oncology

## 2014-07-12 ENCOUNTER — Telehealth: Payer: Self-pay | Admitting: *Deleted

## 2014-07-12 DIAGNOSIS — C50411 Malignant neoplasm of upper-outer quadrant of right female breast: Secondary | ICD-10-CM

## 2014-07-12 DIAGNOSIS — Z510 Encounter for antineoplastic radiation therapy: Secondary | ICD-10-CM | POA: Diagnosis not present

## 2014-07-12 NOTE — Progress Notes (Signed)
MD saw patient backl on the [facility] # room table, EOT, not sent to nursing for assessment, patient given 1 month f/u card to make appt to see MD [facility] 10:37 AM

## 2014-07-12 NOTE — Telephone Encounter (Signed)
Oncology Nurse Navigator Documentation  Oncology Nurse Navigator Flowsheets 07/12/2014  Navigator Encounter Type Telephone  Patient Visit Type Radonc  Treatment Phase Final Radiation Tx  Barriers/Navigation Needs No barriers at this time  Time Spent with Patient 15   Spoke to pt and congratulated on completing xrt.  Relate doing well, denies complaints. Encourage pt to call with questions or concerns. Received verbal understanding.

## 2014-07-12 NOTE — Progress Notes (Signed)
   Department of Radiation Oncology  Phone:  [phone] Fax:        [phone]  Weekly Treatment Note    Name: Sydney Wood Date: 07/12/2014 MRN: [id number] DOB: Jul 16, 1935   Current dose: 50 Gy  Current fraction: 20   MEDICATIONS: Current Outpatient Prescriptions  Medication Sig Dispense Refill  . aspirin 81 MG tablet Take 81 mg by mouth daily.    Marland Kitchen atorvastatin (LIPITOR) 10 MG tablet Take 10 mg by mouth daily.  1  . Calcium Carbonate-Vitamin D (CALTRATE 600+D PO) Take by mouth daily.      . cholecalciferol (VITAMIN D) 400 UNITS TABS Take 400 Units by mouth daily.      Marland Kitchen doxepin (SINEQUAN) 25 MG capsule Take 25 mg by mouth daily.  0  . hyaluronate sodium (RADIAPLEXRX) GEL Apply 1 application topically 2 (two) times daily.    Marland Kitchen HYDROcodone-acetaminophen (NORCO) 10-325 MG per tablet Take 1 tablet by mouth every 6 (six) hours as needed. 20 tablet 0  . ibuprofen (ADVIL,MOTRIN) 100 MG tablet Take 100 mg by mouth as needed for fever.    . Levothyroxine Sodium (SYNTHROID PO) Take 150 mcg by mouth daily.     . Magnesium 200 MG TABS Take 400 mg by mouth daily.     . Multiple Vitamin (MULTIVITAMIN) tablet Take 1 tablet by mouth daily.      . non-metallic deodorant Jethro Poling) MISC Apply 1 application topically daily as needed.    Marland Kitchen omeprazole (PRILOSEC) 20 MG capsule Take 20 mg by mouth daily.     . temazepam (RESTORIL) 15 MG capsule Take 15 mg by mouth at bedtime as needed for sleep.    . vitamin B-12 (CYANOCOBALAMIN) 1000 MCG tablet Take 1,000 mcg by mouth daily.     No current facility-administered medications for this encounter.     ALLERGIES: Latex; Shellfish-derived products; and Simvastatin-high dose   LABORATORY DATA:  Lab Results  Component Value Date   WBC 5.8 04/19/2014   HGB 12.8 04/19/2014   HCT 39.5 04/19/2014   MCV 87.6 04/19/2014   PLT 198 04/19/2014   Lab Results  Component Value Date   NA 140 04/19/2014   K 4.6 04/19/2014   CO2 25 04/19/2014    Lab Results  Component Value Date   ALT 17 04/19/2014   AST 19 04/19/2014   ALKPHOS 96 04/19/2014   BILITOT 0.44 04/19/2014     NARRATIVE: SHENIAH SUPAK was seen today for weekly treatment management. The chart was checked and the patient's films were reviewed.  The patient is doing very well. She has no new complaints today. She is finishing her final fraction today.  PHYSICAL EXAMINATION: vitals were not taken for this visit.     the patient's skin looks excellent. Some dermatitis present in the inframammary region and in the axillary region. No moist desquamation.  ASSESSMENT: The patient is doing satisfactorily with treatment.  PLAN: We will continue with the patient's radiation treatment as planned.

## 2014-07-26 NOTE — Progress Notes (Signed)
  Radiation Oncology         [phone] ________________________________  Name: [name] MRN: [id number]  Date: 07/12/2014  DOB: Jun 12, 1935  End of Treatment Note  Diagnosis:   Right-sided breast cancer     Indication for treatment:  Curative       Radiation treatment dates:   06/14/2014 through 07/12/2014  Site/dose:   The patient initially received a dose of 42.5 Gy in 17 fractions to the breast using whole-breast tangent fields. This was delivered using a 3-D conformal technique. The patient then received a boost to the seroma. This delivered an additional 7.5 Gy in 3 fractions using an en face electron field due to the depth of the seroma. The total dose was 50 Gy.  Narrative: The patient tolerated radiation treatment relatively well.   The patient had some expected skin irritation as she progressed during treatment. Moist desquamation was not present at the end of treatment.  Plan: The patient has completed radiation treatment. The patient will return to radiation oncology clinic for routine followup in one month. I advised the patient to call or return sooner if they have any questions or concerns related to their recovery or treatment. ________________________________  Jodelle Gross, M.D., Ph.D.

## 2014-07-26 NOTE — Progress Notes (Signed)
  Radiation Oncology         [phone] ________________________________  Name: Sydney Wood MRN: [id number]  Date: 06/23/2014  DOB: 04/08/1935  Complex simulation note  The patient has undergone complex simulation for her upcoming boost treatment for her diagnosis of breast cancer. The patient has initially been planned to receive 42.5 Gy. The patient will now receive a 7.5 Gy boost to the seroma cavity which has been contoured. This will be accomplished using an en face electron field. Based on the depth of the target area, 15 MeV electrons will be used. The patient's final total dose therefore will be 50 Gy. A complex isodose plan is requested for the boost treatment.   _______________________________  Jodelle Gross, MD, PhD

## 2014-08-07 ENCOUNTER — Telehealth: Payer: Self-pay | Admitting: Adult Health

## 2014-08-07 NOTE — Telephone Encounter (Signed)
I spoke with [name] to attempt to schedule her Survivorship Clinic appt visit with me and potentially coordinate that visit with her routine 51-month f/u visit with Dr. Lisbeth Renshaw on 08/21/14.  [name] tells me that she is doing very well and is largely without complaints or symptoms.  She would prefer to have me mail her copy of the Survivorship Care Plan (SCP) in lieu of an in-person visit. She expressed appreciation for my call and I will plan to mail her copy of her SCP to her home at her request.  I encouraged her to call me once she received the document if she had any questions or if she began to have any symptoms that we could help with. She expressed verbal understanding of this plan.   Mike Craze, NP [place] [phone]

## 2014-08-16 ENCOUNTER — Encounter: Payer: Self-pay | Admitting: Radiation Oncology

## 2014-08-21 ENCOUNTER — Encounter: Payer: Self-pay | Admitting: Radiation Oncology

## 2014-08-21 ENCOUNTER — Ambulatory Visit
Admission: RE | Admit: 2014-08-21 | Discharge: 2014-08-21 | Disposition: A | Discharge status: Home / Self Care | Payer: Medicare Other | Source: Ambulatory Visit | Attending: Radiation Oncology | Admitting: Radiation Oncology

## 2014-08-21 VITALS — BP 152/74 | HR 71 | Temp 98.4°F | Resp 20 | Ht 64.0 in | Wt 193.3 lb

## 2014-08-21 DIAGNOSIS — C50411 Malignant neoplasm of upper-outer quadrant of right female breast: Secondary | ICD-10-CM

## 2014-08-21 HISTORY — DX: Personal history of irradiation: Z92.3

## 2014-08-21 NOTE — Progress Notes (Signed)
Radiation Oncology         [phone] ________________________________  Name: Sydney Wood MRN: [id number]  Date: 08/21/2014  DOB: 05-20-1935  Follow-Up Visit Note  CC: Jerlyn Ly, MD  Rolm Bookbinder, MD  Diagnosis:   Right-sided breast cancer  Interval Since Last Radiation:  Approximately one month   Narrative:  The patient returns today for routine follow-up.  She has done well overall since she finished treatment. The patient's skin has healed significantly since she completed her course of radiation treatment. She has not begun anti-hormonal treatment.    Follow up right breast rad txs 06/14/14-07/12/14, well heraled,stilldry around the nipple, using radiaplex sometimes, occasional twinges in her breast resolves quickly, appetite, good, energy level fair no pain                            ALLERGIES:  is allergic to latex; shellfish-derived products; and simvastatin-high dose.  Meds: Current Outpatient Prescriptions  Medication Sig Dispense Refill  . aspirin 81 MG tablet Take 81 mg by mouth daily.    Marland Kitchen atorvastatin (LIPITOR) 10 MG tablet Take 10 mg by mouth daily.  1  . Calcium Carbonate-Vitamin D (CALTRATE 600+D PO) Take by mouth daily.      . cholecalciferol (VITAMIN D) 400 UNITS TABS Take 400 Units by mouth daily.      Marland Kitchen doxepin (SINEQUAN) 25 MG capsule Take 25 mg by mouth daily.  0  . ibuprofen (ADVIL,MOTRIN) 100 MG tablet Take 100 mg by mouth as needed for fever.    . Levothyroxine Sodium (SYNTHROID PO) Take 150 mcg by mouth daily.     . Multiple Vitamin (MULTIVITAMIN) tablet Take 1 tablet by mouth daily.      . non-metallic deodorant Jethro Poling) MISC Apply 1 application topically daily as needed.    Marland Kitchen omeprazole (PRILOSEC) 20 MG capsule Take 20 mg by mouth daily.     . temazepam (RESTORIL) 15 MG capsule Take 15 mg by mouth at bedtime as needed for sleep.    . vitamin B-12 (CYANOCOBALAMIN) 1000 MCG tablet Take 1,000 mcg by mouth daily.    . hyaluronate sodium  (RADIAPLEXRX) GEL Apply 1 application topically 2 (two) times daily.    Marland Kitchen HYDROcodone-acetaminophen (NORCO) 10-325 MG per tablet Take 1 tablet by mouth every 6 (six) hours as needed. (Patient not taking: Reported on 08/21/2014) 20 tablet 0  . Magnesium 200 MG TABS Take 1,000 mg by mouth daily. Helps with restless legs at night takes 1000mg      No current facility-administered medications for this encounter.    Physical Findings: The patient is in no acute distress. Patient is alert and oriented.  height is 5\' 4"  (1.626 m) and weight is 193 lb 4.8 oz (87.68 kg). Her oral temperature is 98.4 F (36.9 C). Her blood pressure is 152/74 and her pulse is 71. Her respiration is 20. .   The skin in the treatment area has healed satisfactorily, mild dryness, no areas of concern/moist desquamation/poor healing  Lab Findings: Lab Results  Component Value Date   WBC 5.8 04/19/2014   HGB 12.8 04/19/2014   HCT 39.5 04/19/2014   MCV 87.6 04/19/2014   PLT 198 04/19/2014     Radiographic Findings: No results found.  Impression: The patient has done satisfactorily since finishing treatment.   Plan:  The patient will followup in our clinic on a when necessary basis. Is scheduled to follow up with Dr.Gudena in a year.    ------------------------------------------------  Jodelle Gross, MD, PhD  This document serves as a record of services personally performed by Kyung Rudd, MD. It was created on his behalf by Derek Mound, a trained medical scribe. The creation of this record is based on the scribe's personal observations and the provider's statements to them. This document has been checked and approved by the attending provider.

## 2014-08-21 NOTE — Progress Notes (Signed)
Follow up right breast rad txs 06/14/14-07/12/14, well heraled,stilldry around the nipple, using radiaplex sometimes, occasional twinges in her breast resolves quickly, appetite, good, energy level fair no pain 11:11 AM BP 152/74 mmHg  Pulse 71  Temp(Src) 98.4 F (36.9 C) (Oral)  Resp 20  Ht 5\' 4"  (1.626 m)  Wt 193 lb 4.8 oz (87.68 kg)  BMI 33.16 kg/m2  Wt Readings from Last 3 Encounters:  08/21/14 193 lb 4.8 oz (87.68 kg)  07/06/14 192 lb 14.4 oz (87.499 kg)  06/30/14 193 lb (87.544 kg)

## 2014-10-30 ENCOUNTER — Telehealth: Payer: Self-pay | Admitting: *Deleted

## 2014-10-30 NOTE — Telephone Encounter (Signed)
Spoke to pt for 3 mon f/u. Relate doing well and without complaints. Encourage pt to call with questions or needs. Received verbal understanding.

## 2014-12-19 ENCOUNTER — Other Ambulatory Visit (INDEPENDENT_AMBULATORY_CARE_PROVIDER_SITE_OTHER): Payer: Medicare Other | Admitting: *Deleted

## 2014-12-19 DIAGNOSIS — E785 Hyperlipidemia, unspecified: Secondary | ICD-10-CM

## 2014-12-19 DIAGNOSIS — I10 Essential (primary) hypertension: Secondary | ICD-10-CM

## 2014-12-19 LAB — BASIC METABOLIC PANEL
BUN: 15 mg/dL (ref 7–25)
CALCIUM: 9 mg/dL (ref 8.6–10.4)
CO2: 25 mmol/L (ref 20–31)
Chloride: 101 mmol/L (ref 98–110)
Creat: 0.77 mg/dL (ref 0.60–0.93)
Glucose, Bld: 88 mg/dL (ref 65–99)
Potassium: 4.1 mmol/L (ref 3.5–5.3)
SODIUM: 136 mmol/L (ref 135–146)

## 2014-12-19 LAB — HEPATIC FUNCTION PANEL
ALK PHOS: 83 U/L (ref 33–130)
ALT: 14 U/L (ref 6–29)
AST: 18 U/L (ref 10–35)
Albumin: 4 g/dL (ref 3.6–5.1)
Bilirubin, Direct: 0.1 mg/dL (ref <=0.2)
Indirect Bilirubin: 0.4 mg/dL (ref 0.2–1.2)
TOTAL PROTEIN: 6.9 g/dL (ref 6.1–8.1)
Total Bilirubin: 0.5 mg/dL (ref 0.2–1.2)

## 2014-12-19 NOTE — Addendum Note (Signed)
Addended by: Eulis Foster on: 12/19/2014 08:51 AM   Modules accepted: Orders

## 2014-12-19 NOTE — Addendum Note (Signed)
Addended by: Eulis Foster on: 12/19/2014 08:52 AM   Modules accepted: Orders

## 2014-12-19 NOTE — Addendum Note (Signed)
Addended by: Eulis Foster on: 12/19/2014 08:36 AM   Modules accepted: Orders

## 2014-12-23 LAB — CARDIO IQ(R) ADVANCED LIPID PANEL
Apolipoprotein B: 64 mg/dL (ref 49–103)
Cholesterol, Total: 131 mg/dL (ref 125–200)
Cholesterol/HDL Ratio: 3.1 calc (ref <=5.0)
HDL Cholesterol: 42 mg/dL — ABNORMAL LOW (ref >=46)
LDL Large: 3528 nmol/L — ABNORMAL LOW (ref 5038–17886)
LDL Medium: 194 nmol/L (ref 121–397)
LDL PARTICLE NUMBER: 880 nmol/L — AB (ref 1016–2185)
LDL Peak Size: 216.2 Angstrom — ABNORMAL LOW (ref >=218.2)
LDL SMALL: 169 nmol/L (ref 115–386)
LDL, Calculated: 66 mg/dL
Lipoprotein (a): 25 nmol/L (ref <75)
Non-HDL Cholesterol: 89 mg/dL
TRIGLYCERIDES (CARDIO IQ ADV LIPID PANEL): 116 mg/dL

## 2014-12-25 ENCOUNTER — Encounter: Payer: Self-pay | Admitting: Cardiovascular Disease

## 2014-12-25 ENCOUNTER — Ambulatory Visit (INDEPENDENT_AMBULATORY_CARE_PROVIDER_SITE_OTHER): Payer: Medicare Other | Admitting: Cardiovascular Disease

## 2014-12-25 VITALS — BP 126/84 | HR 75 | Ht 64.0 in | Wt 192.8 lb

## 2014-12-25 DIAGNOSIS — I10 Essential (primary) hypertension: Secondary | ICD-10-CM

## 2014-12-25 NOTE — Progress Notes (Signed)
[name] Date of Birth  08-27-1935 [facility] [address]    [place], [place]  [address] [phone]  Fax  [phone]  Problem List 1. Hypertension 2. Hyperlipidemia 3/. Sleep apnea 4. Breast Cancer    History of Present Illness:  79 yo with a hx of presyncope, hyperlipidemia, sleep apnea.  She has been under a lot of stress recently. Her sister was diagnosed with stage III inflammatory breast cancer. She has been going to [facility] a regular basis to help her sister with chemotherapy and other related issues.  She denies any chest pain, shortness breath, syncope, or presyncope.  She has a history of hypercholesterolemia. Her cholesterol levels are followed by Dr. Joylene Draft.  She brought her blood pressure log with her today. All of her readings are acceptable.  She brought labs were drawn at Dr. Lindell Noe his office. All of her numbers are quite good. Her a protein B. level is 67 which is quite good. Her total cholesterol is 150. The triglyceride level is 129. The HDL is 42. Her LDL is 82.  She's not had any episodes of chest pain or shortness breath.  Nov. 17, 2014: Feeling well.  BP at home is usually normal.  Still traveling lots - helping her sister with breast cancer.  Exercising a bit. tries to walk in the neighborhood.   Nov. 24, 2015:  Shervon is doing well.  Tries to exercise.  BP readings are ok No CP or dyspnea.   Nov. 28, 2016:    Has been diagnosed with right breast breast cancer ( DCIS, stage 0)  Had lumpectomy and 20 XRT treatments. Has had left shoulder problems  Was also found to have Meralgia Paresthetica  - affects her sciatic nerve and left leg pain and tingling .  Is now getting back some energy    Current Outpatient Prescriptions on File Prior to Visit  Medication Sig Dispense Refill  . aspirin 81 MG tablet Take 81 mg by mouth daily.    Marland Kitchen atorvastatin (LIPITOR) 10 MG tablet Take 10 mg by mouth daily.  1  . Calcium  Carbonate-Vitamin D (CALTRATE 600+D PO) Take by mouth daily.      . cholecalciferol (VITAMIN D) 400 UNITS TABS Take 400 Units by mouth daily.      Marland Kitchen doxepin (SINEQUAN) 25 MG capsule Take 25 mg by mouth daily.  0  . hyaluronate sodium (RADIAPLEXRX) GEL Apply 1 application topically 2 (two) times daily.    Marland Kitchen ibuprofen (ADVIL,MOTRIN) 100 MG tablet Take 100 mg by mouth as needed for fever.    . Levothyroxine Sodium (SYNTHROID PO) Take 150 mcg by mouth daily.     . Magnesium 200 MG TABS Take 1,000 mg by mouth daily. Helps with restless legs at night takes 1000mg     . Multiple Vitamin (MULTIVITAMIN) tablet Take 1 tablet by mouth daily.      . non-metallic deodorant (ALRA) MISC Apply 1 application topically daily as needed (AS NEEDED TO AREA).     Marland Kitchen omeprazole (PRILOSEC) 20 MG capsule Take 20 mg by mouth daily.     . temazepam (RESTORIL) 15 MG capsule Take 15 mg by mouth at bedtime as needed for sleep.    . vitamin B-12 (CYANOCOBALAMIN) 1000 MCG tablet Take 1,000 mcg by mouth daily.     No current facility-administered medications on file prior to visit.    Allergies  Allergen Reactions  . Latex Hives and Swelling  . Shellfish-Derived Products Nausea  And Vomiting  . Simvastatin-High Dose Other (See Comments)    Muscle aches, flu like symptoms.     Past Medical History  Diagnosis Date  . Syncope and collapse 2006  . Hypothyroidism   . Hyperlipidemia   . Sleep apnea     CPAP  . GERD (gastroesophageal reflux disease)   . Indigestion   . Breast cancer ([place])   . Wears glasses   . Wears partial dentures   . S/P radiation therapy 06/14/14-07/12/14    right breast 50Gy total dose    Past Surgical History  Procedure Laterality Date  . Tubal ligation    . Carpal tunnel release      LEFT HAND  . US echocardiography  12/12/2004    EF 55-60%  . Cardiovascular stress test  12/17/2004    EF 73%. NO EVIDENCE OF ISCHEMIA  . Colonoscopy    . Dilation and curettage of uterus      x2  . Breast  lumpectomy with radioactive seed localization Right 05/18/2014    Procedure: BREAST LUMPECTOMY WITH RADIOACTIVE SEED LOCALIZATION;  Surgeon: Rolm Bookbinder, MD;  Location: [facility];  Service: General;  Laterality: Right;    History  Smoking status  . Former Smoker  . Quit date: 01/27/1970  Smokeless tobacco  . Never Used    History  Alcohol Use No    Family History  Problem Relation Age of Onset  . Coronary artery disease Father   . Heart attack Father   . Colon cancer Neg Hx   . Stomach cancer Neg Hx   . Breast cancer Sister   . Lymphoma Brother   . Breast cancer Paternal Grandmother     Reviw of Systems:  Reviewed in the HPI.  All other systems are negative.  Physical Exam: BP 126/84 mmHg  Pulse 75  Ht 5\' 4"  (1.626 m)  Wt 192 lb 12.8 oz (87.454 kg)  BMI 33.08 kg/m2 The patient is alert and oriented x 3.  The mood and affect are normal.   Skin: warm and dry.  Color is normal.    HEENT:   Normal carotids, no JVD,   Lungs: clear   Heart: RR, no murmurs    Abdomen: + BS, nontender  Extremities:  No edema  Neuro:  nonfocal.     ECG: Nov. 28, 2016:  NSR at 75.  Normal ECG   Assessment / Plan:   1. Hypertension - her blood pressure readings at home are little bit elevated. I've reviewed the proper way to take a blood pressure reading. I think the blood pressure cuff is slightly twisted on her arm.  She feels great. We will continue with her same medications.   2. Hyperlipidemia- her labs look great. Her cardio IQ shows a LDL particle number of 880 which is very good.   3/. Sleep apnea    Nahser, Wonda Cheng, MD  12/25/2014 11:14 AM    [address],  [place], [place]  [address] Pager [phone] Phone: [phone]; Fax: [phone]   [facility]  [address] [place], [place]  [address] [phone]   Fax [phone]

## 2014-12-25 NOTE — Patient Instructions (Signed)

## 2014-12-28 ENCOUNTER — Encounter: Payer: Self-pay | Admitting: Cardiovascular Disease

## 2015-06-01 ENCOUNTER — Telehealth: Payer: Self-pay | Admitting: Hematology and Oncology

## 2015-06-01 ENCOUNTER — Ambulatory Visit (HOSPITAL_BASED_OUTPATIENT_CLINIC_OR_DEPARTMENT_OTHER): Payer: Medicare Other | Admitting: Hematology and Oncology

## 2015-06-01 ENCOUNTER — Encounter: Payer: Self-pay | Admitting: Hematology and Oncology

## 2015-06-01 VITALS — BP 160/72 | HR 76 | Temp 97.9°F | Resp 18 | Ht 64.0 in | Wt 198.4 lb

## 2015-06-01 DIAGNOSIS — C50411 Malignant neoplasm of upper-outer quadrant of right female breast: Secondary | ICD-10-CM

## 2015-06-01 DIAGNOSIS — Z853 Personal history of malignant neoplasm of breast: Secondary | ICD-10-CM | POA: Diagnosis not present

## 2015-06-01 NOTE — Progress Notes (Signed)
Patient Care Team: Crist Infante, MD as PCP - General (Internal Medicine) Rolm Bookbinder, MD as Consulting Physician (General Surgery) Nicholas Lose, MD as Consulting Physician (Hematology and Oncology) Kyung Rudd, MD as Consulting Physician (Radiation Oncology) Rockwell Germany, RN as Registered Nurse Mauro Kaufmann, RN as Registered Nurse Holley Bouche, NP as Nurse Practitioner (Nurse Practitioner)  DIAGNOSIS: Breast cancer of upper-outer quadrant of right female breast [facility])   Staging form: Breast, AJCC 7th Edition     Clinical stage from 04/19/2014: Stage 0 (Tis (DCIS), N0, M0) - Unsigned       Staging comments: Staged at breast conference on 04/19/14      Pathologic stage from 05/18/2014: Stage Unknown (Tis (DCIS), NX, cM0) - Unsigned   SUMMARY OF ONCOLOGIC HISTORY:   Breast cancer of upper-outer quadrant of right female breast ([place])   04/05/2014 Initial Biopsy Right breast needle core biopsy: DCIS with calcifications. ER- (0%), PR- (0%).   05/18/2014 Surgery Right breast lumpectomy [name]): DCIS, 1 mm from nearest margin (posterior), with calcifications, grade 2, 1.3 cm size.  ER- (0%), PR- (0%).   05/18/2014 Pathologic Stage pTis, pNx: Stage 0   06/14/2014 - 07/12/2014 Radiation Therapy Adjuvant RT completed [facility]): right breast 42.5 Gy over 17 fractions; boost to the seroma 7.5 Gy over 3 fractions; total dose 50 Gy.   09/26/2014 Survivorship Survivorship Care Plan mailed to pt.     CHIEF COMPLIANT: Follow-up after radiation therapy  INTERVAL HISTORY: Sydney Wood is a 80 year old with above-mentioned history of right breast DCIS underwent lumpectomy and radiation. She was ER/PR negative and hence does not need antiestrogen therapy. She completed radiation therapy and appears to been quite well. She is here for follow-up and surveillance.  REVIEW OF SYSTEMS:   Constitutional: Denies fevers, chills or abnormal weight loss Eyes: Denies blurriness of vision Ears, nose,  mouth, throat, and face: Denies mucositis or sore throat Respiratory: Denies cough, dyspnea or wheezes Cardiovascular: Denies palpitation, chest discomfort Gastrointestinal:  Denies nausea, heartburn or change in bowel habits Skin: Denies abnormal skin rashes Lymphatics: Denies new lymphadenopathy or easy bruising Neurological:Denies numbness, tingling or new weaknesses Behavioral/Psych: Mood is stable, no new changes  Extremities: No lower extremity edema Breast:  denies any pain or lumps or nodules in either breasts All other systems were reviewed with the patient and are negative.  I have reviewed the past medical history, past surgical history, social history and family history with the patient and they are unchanged from previous note.  ALLERGIES:  is allergic to latex; shellfish-derived products; and simvastatin-high dose.  MEDICATIONS:  Current Outpatient Prescriptions  Medication Sig Dispense Refill  . aspirin 81 MG tablet Take 81 mg by mouth daily.    Marland Kitchen atorvastatin (LIPITOR) 10 MG tablet Take 10 mg by mouth daily.  1  . Calcium Carbonate-Vitamin D (CALTRATE 600+D PO) Take by mouth daily.      . cholecalciferol (VITAMIN D) 400 UNITS TABS Take 400 Units by mouth daily.      Marland Kitchen doxepin (SINEQUAN) 25 MG capsule Take 25 mg by mouth daily.  0  . hyaluronate sodium (RADIAPLEXRX) GEL Apply 1 application topically 2 (two) times daily.    Marland Kitchen ibuprofen (ADVIL,MOTRIN) 100 MG tablet Take 100 mg by mouth as needed for fever.    . Levothyroxine Sodium (SYNTHROID PO) Take 150 mcg by mouth daily.     . Magnesium 200 MG TABS Take 1,000 mg by mouth daily. Helps with restless legs at night takes 1000mg     .  Multiple Vitamin (MULTIVITAMIN) tablet Take 1 tablet by mouth daily.      . non-metallic deodorant (ALRA) MISC Apply 1 application topically daily as needed (AS NEEDED TO AREA).     Marland Kitchen omeprazole (PRILOSEC) 20 MG capsule Take 20 mg by mouth daily.     Marland Kitchen rOPINIRole (REQUIP) 0.5 MG tablet Take 0.5  mg by mouth daily.  0  . temazepam (RESTORIL) 15 MG capsule Take 15 mg by mouth at bedtime as needed for sleep.    . vitamin B-12 (CYANOCOBALAMIN) 1000 MCG tablet Take 1,000 mcg by mouth daily.     No current facility-administered medications for this visit.    PHYSICAL EXAMINATION: ECOG PERFORMANCE STATUS: 1 - Symptomatic but completely ambulatory  Filed Vitals:   06/01/15 1012  BP: 160/72  Pulse: 76  Temp: 97.9 F (36.6 C)  Resp: 18   Filed Weights   06/01/15 1012  Weight: 198 lb 6.4 oz (89.994 kg)    GENERAL:alert, no distress and comfortable SKIN: skin color, texture, turgor are normal, no rashes or significant lesions EYES: normal, Conjunctiva are pink and non-injected, sclera clear OROPHARYNX:no exudate, no erythema and lips, buccal mucosa, and tongue normal  NECK: supple, thyroid normal size, non-tender, without nodularity LYMPH:  no palpable lymphadenopathy in the cervical, axillary or inguinal LUNGS: clear to auscultation and percussion with normal breathing effort HEART: regular rate & rhythm and no murmurs and no lower extremity edema ABDOMEN:abdomen soft, non-tender and normal bowel sounds MUSCULOSKELETAL:no cyanosis of digits and no clubbing  NEURO: alert & oriented x 3 with fluent speech, no focal motor/sensory deficits EXTREMITIES: No lower extremity edema  LABORATORY DATA:  I have reviewed the data as listed   Chemistry      Component Value Date/Time   NA 136 12/19/2014 0853   NA 140 04/19/2014 1204   K 4.1 12/19/2014 0853   K 4.6 04/19/2014 1204   CL 101 12/19/2014 0853   CO2 25 12/19/2014 0853   CO2 25 04/19/2014 1204   BUN 15 12/19/2014 0853   BUN 15.2 04/19/2014 1204   CREATININE 0.77 12/19/2014 0853   CREATININE 0.8 04/19/2014 1204      Component Value Date/Time   CALCIUM 9.0 12/19/2014 0853   CALCIUM 9.5 04/19/2014 1204   ALKPHOS 83 12/19/2014 0853   ALKPHOS 96 04/19/2014 1204   AST 18 12/19/2014 0853   AST 19 04/19/2014 1204   ALT 14  12/19/2014 0853   ALT 17 04/19/2014 1204   BILITOT 0.5 12/19/2014 0853   BILITOT 0.44 04/19/2014 1204       Lab Results  Component Value Date   WBC 5.8 04/19/2014   HGB 12.8 04/19/2014   HCT 39.5 04/19/2014   MCV 87.6 04/19/2014   PLT 198 04/19/2014   NEUTROABS 4.1 04/19/2014   ASSESSMENT & PLAN:  Breast cancer of upper-outer quadrant of right female breast Right breast high-grade DCIS with calcifications and comedonecrosis ER PR Negative Right Lumpectomy: DCIS 1.3 cm, Er 0%, PR 0% with calcifications Adjuvant radiation 06/14/2014 to 07/12/2014 No role of antiestrogen therapy since she is ER/PR negative  Breast Cancer Surveillance: 1. Breast exam 06/01/2015: Normal 2. Mammogram Done at [facility] April 2017 is normal  Return to clinic in 1 year for follow-up   No orders of the defined types were placed in this encounter.   The patient has a good understanding of the overall plan. she agrees with it. she will call with any problems that may develop before the next visit here.  Rulon Eisenmenger, MD 06/01/2015

## 2015-06-01 NOTE — Assessment & Plan Note (Signed)
Right breast high-grade DCIS with calcifications and comedonecrosis ER PR Negative Right Lumpectomy: DCIS 1.3 cm, Er 0%, PR 0% with calcifications Adjuvant radiation 06/14/2014 to 07/12/2014 No role of antiestrogen therapy since she is ER/PR negative  Breast Cancer Surveillance: 1. Breast exam 06/01/2015: Normal 2. Mammogram   Return to clinic in 1 year with survivorship clinic

## 2015-06-01 NOTE — Telephone Encounter (Signed)
appt made and avs printed °

## 2015-06-12 DIAGNOSIS — Z0000 Encounter for general adult medical examination without abnormal findings: Secondary | ICD-10-CM | POA: Insufficient documentation

## 2015-07-02 ENCOUNTER — Telehealth: Payer: Self-pay | Admitting: Hematology and Oncology

## 2015-07-02 NOTE — Telephone Encounter (Signed)
pt called to cancel 6/7 apt and does not want to resched right now

## 2015-07-04 ENCOUNTER — Encounter: Payer: Medicare Other | Admitting: Genetic Counselor

## 2015-07-04 ENCOUNTER — Other Ambulatory Visit: Payer: Medicare Other

## 2015-11-01 ENCOUNTER — Telehealth: Payer: Self-pay | Admitting: Cardiovascular Disease

## 2015-11-01 NOTE — Telephone Encounter (Signed)
Spoke with patient and advised her to come to office visit on 12/4 fasting for blood work.  I moved her appointment time from 11:15 to 8:30.  She thanked me for the call.

## 2015-11-01 NOTE — Telephone Encounter (Signed)
New message      Pt has an appt scheduled on 12-31-15 with Dr Acie Fredrickson.  She is calling to see if she needs to come in a day or two early for lab work.  Please call

## 2015-12-18 ENCOUNTER — Encounter: Payer: Self-pay | Admitting: Cardiovascular Disease

## 2015-12-19 ENCOUNTER — Encounter: Payer: Self-pay | Admitting: Cardiovascular Disease

## 2015-12-31 ENCOUNTER — Ambulatory Visit: Payer: Medicare Other | Admitting: Cardiovascular Disease

## 2016-02-14 ENCOUNTER — Ambulatory Visit: Payer: Medicare Other | Admitting: Cardiovascular Disease

## 2016-04-10 ENCOUNTER — Telehealth: Payer: Self-pay | Admitting: Cardiovascular Disease

## 2016-04-10 ENCOUNTER — Ambulatory Visit: Payer: Medicare Other | Admitting: Cardiovascular Disease

## 2016-04-10 NOTE — Telephone Encounter (Signed)
Error

## 2016-04-22 ENCOUNTER — Ambulatory Visit (INDEPENDENT_AMBULATORY_CARE_PROVIDER_SITE_OTHER): Payer: Medicare Other | Admitting: Cardiovascular Disease

## 2016-04-22 ENCOUNTER — Encounter: Payer: Self-pay | Admitting: Cardiovascular Disease

## 2016-04-22 VITALS — BP 168/100 | HR 77 | Ht 64.0 in | Wt 196.0 lb

## 2016-04-22 DIAGNOSIS — I10 Essential (primary) hypertension: Secondary | ICD-10-CM | POA: Diagnosis not present

## 2016-04-22 DIAGNOSIS — E782 Mixed hyperlipidemia: Secondary | ICD-10-CM

## 2016-04-22 MED ORDER — POTASSIUM CHLORIDE CRYS ER 20 MEQ PO TBCR: 20.0000 meq | EXTENDED_RELEASE_TABLET | Freq: Every day | ORAL | 11 refills | Status: DC

## 2016-04-22 MED ORDER — HYDROCHLOROTHIAZIDE 25 MG PO TABS: 25.0000 mg | ORAL_TABLET | Freq: Every day | ORAL | 11 refills | Status: DC

## 2016-04-22 NOTE — Progress Notes (Signed)
[name] Date of Birth  [birth year] [facility] [address]    [place], [place]  [address] [phone]  Fax  [phone]  Problem List 1. Hypertension 2. Hyperlipidemia 3. Sleep apnea 4. Breast Cancer    History of Present Illness:  81 yo with a hx of presyncope, hyperlipidemia, sleep apnea.  She has been under a lot of stress recently. Her sister was diagnosed with stage III inflammatory breast cancer. She has been going to [facility] a regular basis to help her sister with chemotherapy and other related issues.  She denies any chest pain, shortness breath, syncope, or presyncope.  She has a history of hypercholesterolemia. Her cholesterol levels are followed by Dr. Joylene Draft.  She brought her blood pressure log with her today. All of her readings are acceptable.  She brought labs were drawn at Dr. Lindell Noe his office. All of her numbers are quite good. Her a protein B. level is 67 which is quite good. Her total cholesterol is 150. The triglyceride level is 129. The HDL is 42. Her LDL is 82.  She's not had any episodes of chest pain or shortness breath.  Nov. 17, 2014: Feeling well.  BP at home is usually normal.  Still traveling lots - helping her sister with breast cancer.  Exercising a bit. tries to walk in the neighborhood.   Nov. 24, 2015:  Shineka is doing well.  Tries to exercise.  BP readings are ok No CP or dyspnea.   Nov. 28, 2016:    Has been diagnosed with right breast breast cancer ( DCIS, stage 0)  Had lumpectomy and 20 XRT treatments. Has had left shoulder problems  Was also found to have Meralgia Paresthetica  - affects her sciatic nerve and left leg pain and tingling .  Is now getting back some energy   April 22, 2016:  Doing well.   BP has been a little elevated   Feeling well  Labs from [facility] look good.  Her total cholesterol is 133.  The triglyceride level is 94.  HDL level is 41.  The LDL is  73.   Current Outpatient Prescriptions on File Prior to Visit  Medication Sig Dispense Refill  . aspirin 81 MG tablet Take 81 mg by mouth daily.    Marland Kitchen atorvastatin (LIPITOR) 10 MG tablet Take 10 mg by mouth daily.  1  . Calcium Carbonate-Vitamin D (CALTRATE 600+D PO) Take by mouth daily.      . cholecalciferol (VITAMIN D) 400 UNITS TABS Take 400 Units by mouth daily.      Marland Kitchen doxepin (SINEQUAN) 25 MG capsule Take 50 mg by mouth daily.   0  . hyaluronate sodium (RADIAPLEXRX) GEL Apply 1 application topically 2 (two) times daily.    Marland Kitchen ibuprofen (ADVIL,MOTRIN) 100 MG tablet Take 100 mg by mouth as needed for fever.    . Levothyroxine Sodium (SYNTHROID PO) Take 150 mcg by mouth daily.     . Magnesium 200 MG TABS Take 1,000 mg by mouth daily. Helps with restless legs at night takes 1000mg     . Multiple Vitamin (MULTIVITAMIN) tablet Take 1 tablet by mouth daily.      . non-metallic deodorant (ALRA) MISC Apply 1 application topically daily as needed (AS NEEDED TO AREA).     Marland Kitchen omeprazole (PRILOSEC) 20 MG capsule Take 20 mg by mouth daily.     Marland Kitchen rOPINIRole (REQUIP) 0.5 MG tablet Take 0.5 mg by mouth daily.  0  .  temazepam (RESTORIL) 15 MG capsule Take 15 mg by mouth at bedtime as needed for sleep.    . vitamin B-12 (CYANOCOBALAMIN) 1000 MCG tablet Take 1,000 mcg by mouth daily.     No current facility-administered medications on file prior to visit.     Allergies  Allergen Reactions  . Latex Hives and Swelling  . Shellfish-Derived Products Nausea And Vomiting  . Simvastatin-High Dose Other (See Comments)    Muscle aches, flu like symptoms.     Past Medical History:  Diagnosis Date  . Breast cancer ([place])   . GERD (gastroesophageal reflux disease)   . Hyperlipidemia   . Hypothyroidism   . Indigestion   . S/P radiation therapy 06/14/14-07/12/14   right breast 50Gy total dose  . Sleep apnea    CPAP  . Syncope and collapse 2006  . Wears glasses   . Wears partial dentures     Past  Surgical History:  Procedure Laterality Date  . BREAST LUMPECTOMY WITH RADIOACTIVE SEED LOCALIZATION Right 05/18/2014   Procedure: BREAST LUMPECTOMY WITH RADIOACTIVE SEED LOCALIZATION;  Surgeon: Rolm Bookbinder, MD;  Location: [facility];  Service: General;  Laterality: Right;  . CARDIOVASCULAR STRESS TEST  12/17/2004   EF 73%. NO EVIDENCE OF ISCHEMIA  . CARPAL TUNNEL RELEASE     LEFT HAND  . COLONOSCOPY    . DILATION AND CURETTAGE OF UTERUS     x2  . TUBAL LIGATION    . US ECHOCARDIOGRAPHY  12/12/2004   EF 55-60%    History  Smoking Status  . Former Smoker  . Quit date: 01/27/1970  Smokeless Tobacco  . Never Used    History  Alcohol Use No    Family History  Problem Relation Age of Onset  . Coronary artery disease Father   . Heart attack Father   . Breast cancer Sister   . Lymphoma Brother   . Breast cancer Paternal Grandmother   . Colon cancer Neg Hx   . Stomach cancer Neg Hx     Reviw of Systems:  Reviewed in the HPI.  All other systems are negative.  Physical Exam: BP (!) 168/100 (BP Location: Left Arm, Patient Position: Sitting, Cuff Size: Large)   Pulse 77   Ht 5\' 4"  (1.626 m)   Wt 196 lb (88.9 kg)   SpO2 92%   BMI 33.64 kg/m  The patient is alert and oriented x 3.  The mood and affect are normal.   Skin: warm and dry.  Color is normal.    HEENT:   Normal carotids, no JVD,  Lungs: clear  Heart: RR, no murmurs   Abdomen: + BS, nontender Extremities:  No edema Neuro:  nonfocal.    ECG:  Reviewed by me  April 22, 2016:  NSR at 52.  Normal ECG   Assessment / Plan:   1. Hypertension - her blood pressure readings at home are little bit elevated. I've reviewed the proper way to take a blood pressure reading. I think the blood pressure cuff is slightly twisted on her arm.  She feels great. We will continue with her same medications.   2. Hyperlipidemia- her labs look great. Her cardio IQ shows a LDL particle number of 880 which is  very good.   3/. Sleep apnea    Mertie Moores, MD  04/22/2016 10:42 AM    [facility] [place],  [place], [place]  [address] Pager [phone] Phone: [phone];  Fax: [phone]

## 2016-04-22 NOTE — Patient Instructions (Signed)
Medication Instructions:  START HCTZ (Hydrochlorothiazide) 25 mg once daily START Kdur (Potassium) 20 meq once daily   Labwork: Your physician recommends that you return for lab work in: 3 weeks for basic metabolic panel   Testing/Procedures: None Ordered   Follow-Up: Your physician wants you to follow-up in: 6 months with Dr. Acie Fredrickson.  You will receive a reminder letter in the mail two months in advance. If you don't receive a letter, please call our office to schedule the follow-up appointment.   If you need a refill on your cardiac medications before your next appointment, please call your pharmacy.   Thank you for choosing [facility]! Christen Bame, RN [phone]

## 2016-05-19 ENCOUNTER — Other Ambulatory Visit: Payer: Medicare Other

## 2016-05-19 DIAGNOSIS — E782 Mixed hyperlipidemia: Secondary | ICD-10-CM

## 2016-05-20 LAB — BASIC METABOLIC PANEL
BUN / CREAT RATIO: 23 (ref 12–28)
BUN: 18 mg/dL (ref 8–27)
CALCIUM: 9.3 mg/dL (ref 8.7–10.3)
CO2: 26 mmol/L (ref 18–29)
CREATININE: 0.77 mg/dL (ref 0.57–1.00)
Chloride: 90 mmol/L — ABNORMAL LOW (ref 96–106)
GFR calc Af Amer: 86 mL/min/{1.73_m2} (ref >59)
GFR calc non Af Amer: 74 mL/min/{1.73_m2} (ref >59)
GLUCOSE: 92 mg/dL (ref 65–99)
Potassium: 4.2 mmol/L (ref 3.5–5.2)
Sodium: 133 mmol/L — ABNORMAL LOW (ref 134–144)

## 2016-05-21 ENCOUNTER — Telehealth: Payer: Self-pay | Admitting: Cardiovascular Disease

## 2016-05-21 NOTE — Telephone Encounter (Signed)
New message       *STAT* If patient is at the pharmacy, call can be transferred to refill team.   1. Which medications need to be refilled? (please list name of each medication and dose if known) HCTZ 25mg , potassium 14meq  2. Which pharmacy/location (including street and city if local pharmacy) is medication to be sent to? [facility] [place]  3. Do they need a 30 day or 90 day supply? 90 day

## 2016-05-21 NOTE — Telephone Encounter (Signed)
Called pt's pharmacy to give a verbal order to change pt's medications into a 90 day supply with refills. Pharmacy verbalized understanding.

## 2016-05-28 NOTE — Assessment & Plan Note (Signed)
Right breast high-grade DCIS with calcifications and comedonecrosis ER PR Negative Right Lumpectomy: DCIS 1.3 cm, Er 0%, PR 0% with calcifications Adjuvant radiation 06/14/2014 to 07/12/2014 No role of antiestrogen therapy since she is ER/PR negative  Breast Cancer Surveillance: 1. Breast exam 05/29/2016: Normal 2. Mammogram Done at [facility] April 2018 is normal  Return to clinic in 1 year for follow-up

## 2016-05-29 ENCOUNTER — Ambulatory Visit (HOSPITAL_BASED_OUTPATIENT_CLINIC_OR_DEPARTMENT_OTHER): Payer: Medicare Other | Admitting: Hematology and Oncology

## 2016-05-29 ENCOUNTER — Encounter: Payer: Self-pay | Admitting: Hematology and Oncology

## 2016-05-29 DIAGNOSIS — Z86000 Personal history of in-situ neoplasm of breast: Secondary | ICD-10-CM

## 2016-05-29 DIAGNOSIS — C50411 Malignant neoplasm of upper-outer quadrant of right female breast: Secondary | ICD-10-CM

## 2016-05-29 DIAGNOSIS — Z171 Estrogen receptor negative status [ER-]: Principal | ICD-10-CM

## 2016-05-29 NOTE — Progress Notes (Signed)
Patient Care Team: Crist Infante, MD as PCP - General (Internal Medicine) Rolm Bookbinder, MD as Consulting Physician (General Surgery) Nicholas Lose, MD as Consulting Physician (Hematology and Oncology) Kyung Rudd, MD as Consulting Physician (Radiation Oncology) Rockwell Germany, RN as Registered Nurse Mauro Kaufmann, RN as Registered Nurse Holley Bouche, NP as Nurse Practitioner (Nurse Practitioner)  DIAGNOSIS:  Encounter Diagnosis  Name Primary?  . Malignant neoplasm of upper-outer quadrant of right breast in female, estrogen receptor negative ([place])     SUMMARY OF ONCOLOGIC HISTORY:   Breast cancer of upper-outer quadrant of right female breast ([place])   04/05/2014 Initial Biopsy    Right breast needle core biopsy: DCIS with calcifications. ER- (0%), PR- (0%).      05/18/2014 Surgery    Right breast lumpectomy [name]): DCIS, 1 mm from nearest margin (posterior), with calcifications, grade 2, 1.3 cm size.  ER- (0%), PR- (0%).      05/18/2014 Pathologic Stage    pTis, pNx: Stage 0      06/14/2014 - 07/12/2014 Radiation Therapy    Adjuvant RT completed [facility]): right breast 42.5 Gy over 17 fractions; boost to the seroma 7.5 Gy over 3 fractions; total dose 50 Gy.      09/26/2014 Survivorship    Survivorship Care Plan mailed to pt.        CHIEF COMPLIANT: Follow-up of DCIS  INTERVAL HISTORY: Sydney Wood is a 81 year old with above-mentioned history of DCIS. She is here for surveillance examination. She denies any lumps or nodules.  She was recently started on antihypertensive medication.  REVIEW OF SYSTEMS:   Constitutional: Denies fevers, chills or abnormal weight loss Eyes: Denies blurriness of vision Ears, nose, mouth, throat, and face: Denies mucositis or sore throat Respiratory: Denies cough, dyspnea or wheezes Cardiovascular: Denies palpitation, chest discomfort Gastrointestinal:  Denies nausea, heartburn or change in bowel habits Skin: Denies abnormal  skin rashes Lymphatics: Denies new lymphadenopathy or easy bruising Neurological:Denies numbness, tingling or new weaknesses Behavioral/Psych: Mood is stable, no new changes  Extremities: No lower extremity edema Breast:  denies any pain or lumps or nodules in either breasts All other systems were reviewed with the patient and are negative.  I have reviewed the past medical history, past surgical history, social history and family history with the patient and they are unchanged from previous note.  ALLERGIES:  is allergic to latex; shellfish-derived products; and simvastatin-high dose.  MEDICATIONS:  Current Outpatient Prescriptions  Medication Sig Dispense Refill  . aspirin 81 MG tablet Take 81 mg by mouth daily.    Marland Kitchen atorvastatin (LIPITOR) 10 MG tablet Take 10 mg by mouth daily.  1  . Calcium Carbonate-Vitamin D (CALTRATE 600+D PO) Take by mouth daily.      . cholecalciferol (VITAMIN D) 400 UNITS TABS Take 400 Units by mouth daily.      Marland Kitchen doxepin (SINEQUAN) 25 MG capsule Take 50 mg by mouth daily.   0  . hyaluronate sodium (RADIAPLEXRX) GEL Apply 1 application topically 2 (two) times daily.    . hydrochlorothiazide (HYDRODIURIL) 25 MG tablet Take 1 tablet (25 mg total) by mouth daily. 30 tablet 11  . ibuprofen (ADVIL,MOTRIN) 100 MG tablet Take 100 mg by mouth as needed for fever.    . Levothyroxine Sodium (SYNTHROID PO) Take 150 mcg by mouth daily.     . Magnesium 200 MG TABS Take 1,000 mg by mouth daily. Helps with restless legs at night takes 1000mg     . Multiple Vitamin (  MULTIVITAMIN) tablet Take 1 tablet by mouth daily.      . non-metallic deodorant (ALRA) MISC Apply 1 application topically daily as needed (AS NEEDED TO AREA).     Marland Kitchen omeprazole (PRILOSEC) 20 MG capsule Take 20 mg by mouth daily.     . potassium chloride SA (K-DUR,KLOR-CON) 20 MEQ tablet Take 1 tablet (20 mEq total) by mouth daily. 30 tablet 11  . rOPINIRole (REQUIP) 0.5 MG tablet Take 0.5 mg by mouth daily.  0  .  temazepam (RESTORIL) 15 MG capsule Take 15 mg by mouth at bedtime as needed for sleep.    . vitamin B-12 (CYANOCOBALAMIN) 1000 MCG tablet Take 1,000 mcg by mouth daily.     No current facility-administered medications for this visit.     PHYSICAL EXAMINATION: ECOG PERFORMANCE STATUS: 1 - Symptomatic but completely ambulatory  Vitals:   05/29/16 1106  BP: (!) 147/78  Pulse: 79  Resp: 18  Temp: 97.9 F (36.6 C)   Filed Weights   05/29/16 1106  Weight: 199 lb 11.2 oz (90.6 kg)    GENERAL:alert, no distress and comfortable SKIN: skin color, texture, turgor are normal, no rashes or significant lesions EYES: normal, Conjunctiva are pink and non-injected, sclera clear OROPHARYNX:no exudate, no erythema and lips, buccal mucosa, and tongue normal  NECK: supple, thyroid normal size, non-tender, without nodularity LYMPH:  no palpable lymphadenopathy in the cervical, axillary or inguinal LUNGS: clear to auscultation and percussion with normal breathing effort HEART: regular rate & rhythm and no murmurs and no lower extremity edema ABDOMEN:abdomen soft, non-tender and normal bowel sounds MUSCULOSKELETAL:no cyanosis of digits and no clubbing  NEURO: alert & oriented x 3 with fluent speech, no focal motor/sensory deficits EXTREMITIES: No lower extremity edema BREAST: No palpable masses or nodules in either right or left breasts. No palpable axillary supraclavicular or infraclavicular adenopathy no breast tenderness or nipple discharge. (exam performed in the presence of a chaperone)  LABORATORY DATA:  I have reviewed the data as listed   Chemistry      Component Value Date/Time   NA 133 (L) 05/19/2016 1352   NA 140 04/19/2014 1204   K 4.2 05/19/2016 1352   K 4.6 04/19/2014 1204   CL 90 (L) 05/19/2016 1352   CO2 26 05/19/2016 1352   CO2 25 04/19/2014 1204   BUN 18 05/19/2016 1352   BUN 15.2 04/19/2014 1204   CREATININE 0.77 05/19/2016 1352   CREATININE 0.77 12/19/2014 0853    CREATININE 0.8 04/19/2014 1204      Component Value Date/Time   CALCIUM 9.3 05/19/2016 1352   CALCIUM 9.5 04/19/2014 1204   ALKPHOS 83 12/19/2014 0853   ALKPHOS 96 04/19/2014 1204   AST 18 12/19/2014 0853   AST 19 04/19/2014 1204   ALT 14 12/19/2014 0853   ALT 17 04/19/2014 1204   BILITOT 0.5 12/19/2014 0853   BILITOT 0.44 04/19/2014 1204       Lab Results  Component Value Date   WBC 5.8 04/19/2014   HGB 12.8 04/19/2014   HCT 39.5 04/19/2014   MCV 87.6 04/19/2014   PLT 198 04/19/2014   NEUTROABS 4.1 04/19/2014    ASSESSMENT & PLAN:  Breast cancer of upper-outer quadrant of right female breast Right breast high-grade DCIS with calcifications and comedonecrosis ER PR Negative Right Lumpectomy: DCIS 1.3 cm, Er 0%, PR 0% with calcifications Adjuvant radiation 06/14/2014 to 07/12/2014 No role of antiestrogen therapy since she is ER/PR negative  Breast Cancer Surveillance: 1. Breast exam 05/29/2016:  No palpable lumps or nodules   2. Mammogram Done at [facility] 04/15/2016 is normal  Return to clinic in 1 year for follow-up  I spent 15 minutes talking to the patient of which more than half was spent in counseling and coordination of care.  No orders of the defined types were placed in this encounter.  The patient has a good understanding of the overall plan. she agrees with it. she will call with any problems that may develop before the next visit here.   Rulon Eisenmenger, MD 05/29/16

## 2016-11-28 ENCOUNTER — Encounter: Payer: Self-pay | Admitting: Cardiovascular Disease

## 2016-12-02 ENCOUNTER — Emergency Department (HOSPITAL_COMMUNITY)
Admission: EM | Admit: 2016-12-02 | Discharge: 2016-12-02 | Disposition: A | Discharge status: Home / Self Care | Payer: Medicare Other | Attending: Emergency Medicine | Admitting: Emergency Medicine

## 2016-12-02 ENCOUNTER — Emergency Department (HOSPITAL_COMMUNITY): Payer: Medicare Other

## 2016-12-02 DIAGNOSIS — R531 Weakness: Secondary | ICD-10-CM | POA: Diagnosis present

## 2016-12-02 DIAGNOSIS — Z79899 Other long term (current) drug therapy: Secondary | ICD-10-CM | POA: Diagnosis not present

## 2016-12-02 DIAGNOSIS — E039 Hypothyroidism, unspecified: Secondary | ICD-10-CM | POA: Diagnosis not present

## 2016-12-02 DIAGNOSIS — Z91040 Latex allergy status: Secondary | ICD-10-CM | POA: Diagnosis not present

## 2016-12-02 DIAGNOSIS — R0600 Dyspnea, unspecified: Secondary | ICD-10-CM | POA: Insufficient documentation

## 2016-12-02 DIAGNOSIS — Z853 Personal history of malignant neoplasm of breast: Secondary | ICD-10-CM | POA: Insufficient documentation

## 2016-12-02 DIAGNOSIS — Z7982 Long term (current) use of aspirin: Secondary | ICD-10-CM | POA: Insufficient documentation

## 2016-12-02 DIAGNOSIS — E785 Hyperlipidemia, unspecified: Secondary | ICD-10-CM | POA: Diagnosis not present

## 2016-12-02 DIAGNOSIS — Z87891 Personal history of nicotine dependence: Secondary | ICD-10-CM | POA: Insufficient documentation

## 2016-12-02 LAB — BASIC METABOLIC PANEL
ANION GAP: 9 (ref 5–15)
BUN: 15 mg/dL (ref 6–20)
CHLORIDE: 96 mmol/L — AB (ref 101–111)
CO2: 30 mmol/L (ref 22–32)
Calcium: 9.3 mg/dL (ref 8.9–10.3)
Creatinine, Ser: 0.86 mg/dL (ref 0.44–1.00)
GFR calc Af Amer: >60 mL/min (ref >60)
GFR calc non Af Amer: >60 mL/min (ref >60)
GLUCOSE: 155 mg/dL — AB (ref 65–99)
POTASSIUM: 4.2 mmol/L (ref 3.5–5.1)
Sodium: 135 mmol/L (ref 135–145)

## 2016-12-02 LAB — CBC
HCT: 38.8 % (ref 36.0–46.0)
Hemoglobin: 13.1 g/dL (ref 12.0–15.0)
MCH: 29.8 pg (ref 26.0–34.0)
MCHC: 33.8 g/dL (ref 30.0–36.0)
MCV: 88.4 fL (ref 78.0–100.0)
Platelets: 179 K/uL (ref 150–400)
RBC: 4.39 MIL/uL (ref 3.87–5.11)
RDW: 14.2 % (ref 11.5–15.5)
WBC: 6.1 K/uL (ref 4.0–10.5)

## 2016-12-02 LAB — TROPONIN I: Troponin I: <0.03 ng/mL (ref <0.03)

## 2016-12-02 LAB — CBG MONITORING, ED: Glucose-Capillary: 154 mg/dL — ABNORMAL HIGH (ref 65–99)

## 2016-12-02 NOTE — ED Notes (Signed)
Bed: FB37 Expected date:  Expected time:  Means of arrival:  Comments: EMS- possible hypoglycemia

## 2016-12-02 NOTE — ED Provider Notes (Signed)
[facility] DEPT Provider Note   CSN: [id number] Arrival date & time: 12/02/16  6387     History   Chief Complaint Chief Complaint  Patient presents with  . Weakness; Shortness of Breath    HPI Sydney Wood is a 81 y.o. female.  HPI Patient is a 81 year old female presents the emergency department with an episode of severe weakness and shortness of breath that occurred while she was walking from her bed to her restroom this morning.  She felt weak and thought this may have been a hypoglycemic episode so she took some food in.  She began having severe shortness of breath without chest pain or chest tightness.  This was transient and now has since resolved.  At this time she is asymptomatic.  She denies chest pain chest tightness.  No shortness of breath at this time.  No recent productive cough.  Denies fevers and chills.  No history of DVT or pulmonary embolism.  No prior history of cardiac disease.  She does not have a history of diabetes but states she has intermittent episodes of generalized weakness which her doctor suspect could be hypoglycemic episodes and thus she was encouraged to take food and sweets and when she feels like this.   Past Medical History:  Diagnosis Date  . Breast cancer ([place])   . GERD (gastroesophageal reflux disease)   . Hyperlipidemia   . Hypothyroidism   . Indigestion   . S/P radiation therapy 06/14/14-07/12/14   right breast 50Gy total dose  . Sleep apnea    CPAP  . Syncope and collapse 2006  . Wears glasses   . Wears partial dentures     Patient Active Problem List   Diagnosis Date Noted  . Mixed hyperlipidemia 04/22/2016  . Breast cancer of upper-outer quadrant of right female breast ([place]) 04/19/2014  . DCIS (ductal carcinoma in situ) 04/19/2014  . HTN (hypertension) 12/20/2013  . Dizziness 11/21/2010  . Hypercholesterolemia 11/21/2010    Past Surgical History:  Procedure Laterality Date  .  CARDIOVASCULAR STRESS TEST  12/17/2004   EF 73%. NO EVIDENCE OF ISCHEMIA  . CARPAL TUNNEL RELEASE     LEFT HAND  . COLONOSCOPY    . DILATION AND CURETTAGE OF UTERUS     x2  . TUBAL LIGATION    . US ECHOCARDIOGRAPHY  12/12/2004   EF 55-60%    OB History    No data available       Home Medications    Prior to Admission medications   Medication Sig Start Date End Date Taking? Authorizing Provider  aspirin 81 MG tablet Take 81 mg by mouth daily.    [provider]  atorvastatin (LIPITOR) 10 MG tablet Take 10 mg by mouth daily. 12/13/13   [provider]  Calcium Carbonate-Vitamin D (CALTRATE 600+D PO) Take by mouth daily.      [provider]  cholecalciferol (VITAMIN D) 400 UNITS TABS Take 400 Units by mouth daily.      [provider]  doxepin (SINEQUAN) 25 MG capsule Take 50 mg by mouth daily.  12/13/13   [provider]  hydrochlorothiazide (HYDRODIURIL) 25 MG tablet Take 1 tablet (25 mg total) by mouth daily. 04/22/16 07/21/16  Nahser, Wonda Cheng, MD  ibuprofen (ADVIL,MOTRIN) 100 MG tablet Take 100 mg by mouth as needed for fever.    [provider]  Levothyroxine Sodium (SYNTHROID PO) Take 150 mcg by mouth daily.     [provider]  Magnesium 200 MG TABS Take 1,000 mg by mouth daily. Helps with restless legs at night takes 1000mg     [provider]  Multiple Vitamin (MULTIVITAMIN) tablet Take 1 tablet by mouth daily.      [provider]  omeprazole (PRILOSEC) 20 MG capsule Take 20 mg by mouth daily.     [provider]  potassium chloride SA (K-DUR,KLOR-CON) 20 MEQ tablet Take 1 tablet (20 mEq total) by mouth daily. 04/22/16   Nahser, Wonda Cheng, MD  rOPINIRole (REQUIP) 0.5 MG tablet Take 0.5 mg by mouth daily. 12/24/14   [provider]  temazepam (RESTORIL) 15 MG capsule Take 15 mg by mouth at bedtime as needed for sleep.    [provider]    Family History Family History   Problem Relation Age of Onset  . Coronary artery disease Father   . Heart attack Father   . Breast cancer Sister   . Lymphoma Brother   . Breast cancer Paternal Grandmother   . Colon cancer Neg Hx   . Stomach cancer Neg Hx     Social History Social History   Tobacco Use  . Smoking status: Former Smoker    Last attempt to quit: 01/27/1970    Years since quitting: 46.8  . Smokeless tobacco: Never Used  Substance Use Topics  . Alcohol use: No  . Drug use: No     Allergies   Latex; Shellfish-derived products; and Simvastatin-high dose   Review of Systems Review of Systems  All other systems reviewed and are negative.    Physical Exam Updated Vital Signs BP (!) 152/77 (BP Location: Left Arm)   Pulse 69   Temp 98 F (36.7 C) (Oral)   Resp 18   SpO2 98%   Physical Exam  Constitutional: She is oriented to person, place, and time. She appears well-developed and well-nourished. No distress.  HENT:  Head: Normocephalic and atraumatic.  Eyes: EOM are normal.  Neck: Normal range of motion.  Cardiovascular: Normal rate, regular rhythm and normal heart sounds.  Pulmonary/Chest: Effort normal and breath sounds normal.  Abdominal: Soft. She exhibits no distension. There is no tenderness.  Musculoskeletal: Normal range of motion.  Neurological: She is alert and oriented to person, place, and time.  Skin: Skin is warm and dry.  Psychiatric: She has a normal mood and affect. Judgment normal.  Nursing note and vitals reviewed.    ED Treatments / Results  Labs (all labs ordered are listed, but only abnormal results are displayed) Labs Reviewed  BASIC METABOLIC PANEL - Abnormal; Notable for the following components:      Result Value   Chloride 96 (*)    Glucose, Bld 155 (*)    All other components within normal limits  CBG MONITORING, ED - Abnormal; Notable for the following components:   Glucose-Capillary 154 (*)    All other components within normal limits  CBC    TROPONIN I    EKG  EKG Interpretation  Date/Time:  Tuesday December 02 2016 09:19:47 EST Ventricular Rate:  69 PR Interval:    QRS Duration: 99 QT Interval:  443 QTC Calculation: 475 R Axis:   27 Text Interpretation:  Sinus rhythm Borderline T abnormalities, anterior leads No old tracing to compare Confirmed by Jola Schmidt [phone]) on 12/02/2016 1:33:13 PM       Radiology Dg Chest 2 View  Result Date: 12/02/2016 CLINICAL DATA:  Weakness and shortness of breath for a few hours EXAM: CHEST  2 VIEW COMPARISON:  None. FINDINGS: Cardiac shadow is mildly enlarged. Aortic calcifications are seen. The lungs are well aerated bilaterally. No focal infiltrate or sizable effusion is noted. Degenerative changes of the thoracic spine are seen. IMPRESSION: No acute abnormality noted. Electronically Signed   By: Inez Catalina M.D.   On: 12/02/2016 10:30    Procedures Procedures (including critical care time)  Medications Ordered in ED Medications - No data to display   Initial Impression / Assessment and Plan / ED Course  I have reviewed the triage vital signs and the nursing notes.  Pertinent labs & imaging results that were available during my care of the patient were reviewed by me and considered in my medical decision making (see chart for details).     Overall well-appearing.  Symptoms are transient.  Asymptomatic in the emergency department.  Vital signs stable.  Workup in the emergency department without abnormality.  Discharged home in good condition.  Primary care follow-up.  Patient understands return to the ER for new or worsening symptoms.  Final Clinical Impressions(s) / ED Diagnoses   Final diagnoses:  Dyspnea, unspecified type    ED Discharge Orders    None       Jola Schmidt, MD 12/02/16 [phone]

## 2016-12-02 NOTE — ED Triage Notes (Signed)
Per EMS, pt is coming from home with complaints of weakness and SOB that started at 8am this morning. Pt reports having theses symptoms twice a year and it was related to hyperglycemia. Pt does not a respiratory hx and hx of diabetes. Pt denies chest pain and n/v. EMS reports that pt reports having relief from SOB and weakness at this time.

## 2016-12-02 NOTE — ED Notes (Signed)
Pt had drawn for labs:  Lt green Dark green  [place]

## 2016-12-11 ENCOUNTER — Ambulatory Visit: Payer: Medicare Other | Admitting: Cardiovascular Disease

## 2016-12-11 ENCOUNTER — Encounter: Payer: Self-pay | Admitting: Cardiovascular Disease

## 2016-12-11 VITALS — BP 136/90 | HR 100 | Ht 63.0 in | Wt 196.4 lb

## 2016-12-11 DIAGNOSIS — E7800 Pure hypercholesterolemia, unspecified: Secondary | ICD-10-CM

## 2016-12-11 DIAGNOSIS — I10 Essential (primary) hypertension: Secondary | ICD-10-CM

## 2016-12-11 NOTE — Patient Instructions (Signed)
Medication Instructions:  Your physician recommends that you continue on your current medications as directed. Please refer to the Current Medication list given to you today.   Labwork: None Ordered   Testing/Procedures: None Ordered   Follow-Up: Your physician wants you to follow-up in: 6 months with Dr. Nahser.  You will receive a reminder letter in the mail two months in advance. If you don't receive a letter, please call our office to schedule the follow-up appointment.   If you need a refill on your cardiac medications before your next appointment, please call your pharmacy.   Thank you for choosing [facility]! Lajuana Patchell, RN [phone]    

## 2016-12-11 NOTE — Progress Notes (Signed)
Sydney Wood Date of Birth  Oct 15, 1935 [facility] [address]    [place], [place]  [address] [phone]  Fax  [phone]  Problem List 1. Hypertension 2. Hyperlipidemia 3. Sleep apnea 4. Breast Cancer    History of Present Illness:  81 yo with a hx of presyncope, hyperlipidemia, sleep apnea.  She has been under a lot of stress recently. Her sister was diagnosed with stage III inflammatory breast cancer. She has been going to [facility] a regular basis to help her sister with chemotherapy and other related issues.  She denies any chest pain, shortness breath, syncope, or presyncope.  She has a history of hypercholesterolemia. Her cholesterol levels are followed by Dr. Joylene Draft.  She brought her blood pressure log with her today. All of her readings are acceptable.  She brought labs were drawn at Dr. Lindell Noe his office. All of her numbers are quite good. Her a protein B. level is 67 which is quite good. Her total cholesterol is 150. The triglyceride level is 129. The HDL is 42. Her LDL is 82.  She's not had any episodes of chest pain or shortness breath.  Nov. 17, 2014: Feeling well.  BP at home is usually normal.  Still traveling lots - helping her sister with breast cancer.  Exercising a bit. tries to walk in the neighborhood.   Nov. 24, 2015:  Jaya is doing well.  Tries to exercise.  BP readings are ok No CP or dyspnea.   Nov. 28, 2016:    Has been diagnosed with right breast breast cancer ( DCIS, stage 0)  Had lumpectomy and 20 XRT treatments. Has had left shoulder problems  Was also found to have Meralgia Paresthetica  - affects her sciatic nerve and left leg pain and tingling .  Is now getting back some energy   April 22, 2016:  Doing well.   BP has been a little elevated   Feeling well  Labs from [facility] look good.  Her total cholesterol is 133.  The triglyceride level is 94.  HDL level is 41.  The LDL is  73.  Nov. 15, 2018:    Doing ok We started [place] last visit.   Thinks her vision is not as good ,  More blurred vision Walks on occasion    Current Outpatient Medications on File Prior to Visit  Medication Sig Dispense Refill  . aspirin 81 MG tablet Take 81 mg by mouth daily.    Marland Kitchen atorvastatin (LIPITOR) 10 MG tablet Take 10 mg by mouth daily.  1  . cholecalciferol (VITAMIN D) 400 UNITS TABS Take 400 Units by mouth daily.      Marland Kitchen doxepin (SINEQUAN) 50 MG capsule Take 50 mg daily by mouth.    . hydrochlorothiazide (HYDRODIURIL) 25 MG tablet Take 1 tablet (25 mg total) by mouth daily. 30 tablet 11  . ibuprofen (ADVIL,MOTRIN) 100 MG tablet Take 100 mg as needed by mouth for pain.     . Levothyroxine Sodium (SYNTHROID PO) Take 150 mcg by mouth daily.     . Magnesium 200 MG TABS Take 1,000 mg daily by mouth.     . Multiple Vitamin (MULTIVITAMIN) tablet Take 1 tablet by mouth daily.      Marland Kitchen omeprazole (PRILOSEC) 20 MG capsule Take 20 mg by mouth daily.     . potassium chloride SA (K-DUR,KLOR-CON) 20 MEQ tablet Take 1 tablet (20 mEq total) by mouth daily. 30 tablet 11  .  rOPINIRole (REQUIP) 0.5 MG tablet Take 0.5 mg by mouth daily.  0  . temazepam (RESTORIL) 15 MG capsule Take 15 mg by mouth at bedtime as needed for sleep.     No current facility-administered medications on file prior to visit.     Allergies  Allergen Reactions  . Latex Hives and Swelling  . Shellfish-Derived Products Nausea And Vomiting  . Simvastatin-High Dose Other (See Comments)    Muscle aches, flu like symptoms.     Past Medical History:  Diagnosis Date  . Breast cancer ([place])   . GERD (gastroesophageal reflux disease)   . Hyperlipidemia   . Hypothyroidism   . Indigestion   . S/P radiation therapy 06/14/14-07/12/14   right breast 50Gy total dose  . Sleep apnea    CPAP  . Syncope and collapse 2006  . Wears glasses   . Wears partial dentures     Past Surgical History:  Procedure Laterality Date  . BREAST  LUMPECTOMY WITH RADIOACTIVE SEED LOCALIZATION Right 05/18/2014   Procedure: BREAST LUMPECTOMY WITH RADIOACTIVE SEED LOCALIZATION;  Surgeon: Rolm Bookbinder, MD;  Location: [facility];  Service: General;  Laterality: Right;  . CARDIOVASCULAR STRESS TEST  12/17/2004   EF 73%. NO EVIDENCE OF ISCHEMIA  . CARPAL TUNNEL RELEASE     LEFT HAND  . COLONOSCOPY    . DILATION AND CURETTAGE OF UTERUS     x2  . TUBAL LIGATION    . US ECHOCARDIOGRAPHY  12/12/2004   EF 55-60%    Social History   Tobacco Use  Smoking Status Former Smoker  . Last attempt to quit: 01/27/1970  . Years since quitting: 46.9  Smokeless Tobacco Never Used    Social History   Substance and Sexual Activity  Alcohol Use No    Family History  Problem Relation Age of Onset  . Coronary artery disease Father   . Heart attack Father   . Breast cancer Sister   . Lymphoma Brother   . Breast cancer Paternal Grandmother   . Colon cancer Neg Hx   . Stomach cancer Neg Hx     Reviw of Systems:  Reviewed in the HPI.  All other systems are negative.  Physical Exam: Blood pressure 136/90, pulse 100, height 5\' 3"  (1.6 m), weight 196 lb 6.4 oz (89.1 kg), SpO2 92 %.  GEN:  Well nourished, well developed in no acute distress HEENT: Normal NECK: No JVD; No carotid bruits LYMPHATICS: No lymphadenopathy CARDIAC: RR, no murmurs, rubs, gallops RESPIRATORY:  Clear to auscultation without rales, wheezing or rhonchi  ABDOMEN: Soft, non-tender, non-distended MUSCULOSKELETAL:  No edema; No deformity  SKIN: Warm and dry NEUROLOGIC:  Alert and oriented x 3   ECG:      Assessment / Plan:   1. Hypertension -   BP is well controlled Continue meds  Brought her BP log.   Readings look good   2. Hyperlipidemia-    3/. Sleep apnea  4. Weakness:   Had an episode of generalized weakness last week. Saw Dr. Joylene Draft .  May have passed out for a few seconds.  May have been hypoglycemia  Or anxiety  Her symptoms do  not sound particularly cardiac.  I have asked her to let me know if she has any further episodes of syncope or near syncope.  We would want to place an event monitor on her.    Mertie Moores, MD  12/11/2016 3:29 PM    [facility] [phone]  [address]  [place], [place]  [address] Pager [phone] Phone: [phone]; Fax: [phone]

## 2017-05-29 ENCOUNTER — Ambulatory Visit: Payer: Medicare Other | Admitting: Hematology and Oncology

## 2017-06-01 ENCOUNTER — Inpatient Hospital Stay: Payer: Medicare Other | Attending: Hematology and Oncology | Admitting: Hematology and Oncology

## 2017-06-01 DIAGNOSIS — Z171 Estrogen receptor negative status [ER-]: Secondary | ICD-10-CM | POA: Diagnosis not present

## 2017-06-01 DIAGNOSIS — C50411 Malignant neoplasm of upper-outer quadrant of right female breast: Secondary | ICD-10-CM

## 2017-06-01 DIAGNOSIS — D0511 Intraductal carcinoma in situ of right breast: Secondary | ICD-10-CM | POA: Insufficient documentation

## 2017-06-01 MED ORDER — TELMISARTAN 20 MG PO TABS: 20.0000 mg | ORAL_TABLET | Freq: Every day | ORAL | Status: DC

## 2017-06-01 MED ORDER — ASPIRIN 81 MG PO TABS: 81.0000 mg | ORAL_TABLET | ORAL | Status: DC

## 2017-06-01 NOTE — Assessment & Plan Note (Signed)
Right breast high-grade DCIS with calcifications and comedonecrosis ER PR Negative Right Lumpectomy: DCIS 1.3 cm, Er 0%, PR 0% with calcifications Adjuvant radiation 06/14/2014 to 07/12/2014 No role of antiestrogen therapy since she is ER/PR negative  Breast Cancer Surveillance: 1. Breast exam 06/01/2017: No palpable lumps or nodules   2. Mammogram Done at [facility] 05/16/2017 is normal  Return to clinic in 1 year for follow-up

## 2017-06-01 NOTE — Progress Notes (Signed)
Patient Care Team: Crist Infante, MD as PCP - General (Internal Medicine) Rolm Bookbinder, MD as Consulting Physician (General Surgery) Nicholas Lose, MD as Consulting Physician (Hematology and Oncology) Kyung Rudd, MD as Consulting Physician (Radiation Oncology) Rockwell Germany, RN as Registered Nurse Mauro Kaufmann, RN as Registered Nurse Holley Bouche, NP as Nurse Practitioner (Nurse Practitioner)  DIAGNOSIS:  Encounter Diagnosis  Name Primary?  . Malignant neoplasm of upper-outer quadrant of right breast in female, estrogen receptor negative ([name])     SUMMARY OF ONCOLOGIC HISTORY:   Breast cancer of upper-outer quadrant of right female breast ([name])   04/05/2014 Initial Biopsy    Right breast needle core biopsy: DCIS with calcifications. ER- (0%), PR- (0%).      05/18/2014 Surgery    Right breast lumpectomy [name]): DCIS, 1 mm from nearest margin (posterior), with calcifications, grade 2, 1.3 cm size.  ER- (0%), PR- (0%).      05/18/2014 Pathologic Stage    pTis, pNx: Stage 0      06/14/2014 - 07/12/2014 Radiation Therapy    Adjuvant RT completed [facility]): right breast 42.5 Gy over 17 fractions; boost to the seroma 7.5 Gy over 3 fractions; total dose 50 Gy.      09/26/2014 Survivorship    Survivorship Care Plan mailed to pt.        CHIEF COMPLIANT: Annual follow-up of DCIS  INTERVAL HISTORY: Sydney Wood is a 82 year old with above-mentioned history of DCIS ER PR negative is currently in surveillance.  She denies any lumps or nodules in the breast.  REVIEW OF SYSTEMS:   Constitutional: Denies fevers, chills or abnormal weight loss Eyes: Denies blurriness of vision Ears, nose, mouth, throat, and face: Denies mucositis or sore throat Respiratory: Denies cough, dyspnea or wheezes Cardiovascular: Denies palpitation, chest discomfort Gastrointestinal:  Denies nausea, heartburn or change in bowel habits Skin: Denies abnormal skin rashes Lymphatics:  Denies new lymphadenopathy or easy bruising Neurological:Denies numbness, tingling or new weaknesses Behavioral/Psych: Mood is stable, no new changes  Extremities: No lower extremity edema Breast:  denies any pain or lumps or nodules in either breasts All other systems were reviewed with the patient and are negative.  I have reviewed the past medical history, past surgical history, social history and family history with the patient and they are unchanged from previous note.  ALLERGIES:  is allergic to latex; shellfish-derived products; and simvastatin-high dose.  MEDICATIONS:  Current Outpatient Medications  Medication Sig Dispense Refill  . aspirin 81 MG tablet Take 1 tablet (81 mg total) by mouth 3 (three) times a week. 30 tablet   . atorvastatin (LIPITOR) 10 MG tablet Take 10 mg by mouth daily.  1  . cholecalciferol (VITAMIN D) 400 UNITS TABS Take 400 Units by mouth daily.      Marland Kitchen doxepin (SINEQUAN) 50 MG capsule Take 50 mg daily by mouth.    . hydrochlorothiazide (HYDRODIURIL) 25 MG tablet Take 1 tablet (25 mg total) by mouth daily. 30 tablet 11  . ibuprofen (ADVIL,MOTRIN) 100 MG tablet Take 100 mg as needed by mouth for pain.     . Levothyroxine Sodium (SYNTHROID PO) Take 150 mcg by mouth daily.     . Magnesium 200 MG TABS Take 1,000 mg daily by mouth.     . Multiple Vitamin (MULTIVITAMIN) tablet Take 1 tablet by mouth daily.      Marland Kitchen omeprazole (PRILOSEC) 20 MG capsule Take 20 mg by mouth daily.     Marland Kitchen rOPINIRole (REQUIP) 0.5  MG tablet Take 0.5 mg by mouth daily.  0  . telmisartan (MICARDIS) 20 MG tablet Take 1 tablet (20 mg total) by mouth daily.    . temazepam (RESTORIL) 15 MG capsule Take 15 mg by mouth at bedtime as needed for sleep.     No current facility-administered medications for this visit.     PHYSICAL EXAMINATION: ECOG PERFORMANCE STATUS: 1 - Symptomatic but completely ambulatory  Vitals:   06/01/17 1153  BP: 124/69  Pulse: 69  Resp: 17  Temp: 98.5 F (36.9 C)    SpO2: 96%   Filed Weights   06/01/17 1153  Weight: 197 lb 4.8 oz (89.5 kg)    GENERAL:alert, no distress and comfortable SKIN: skin color, texture, turgor are normal, no rashes or significant lesions EYES: normal, Conjunctiva are pink and non-injected, sclera clear OROPHARYNX:no exudate, no erythema and lips, buccal mucosa, and tongue normal  NECK: supple, thyroid normal size, non-tender, without nodularity LYMPH:  no palpable lymphadenopathy in the cervical, axillary or inguinal LUNGS: clear to auscultation and percussion with normal breathing effort HEART: regular rate & rhythm and no murmurs and no lower extremity edema ABDOMEN:abdomen soft, non-tender and normal bowel sounds MUSCULOSKELETAL:no cyanosis of digits and no clubbing  NEURO: alert & oriented x 3 with fluent speech, no focal motor/sensory deficits EXTREMITIES: No lower extremity edema BREAST: Patient wishes to follow with the primary care physician for breast exams.  No palpable masses or nodules in either right or left breasts. No palpable axillary supraclavicular or infraclavicular adenopathy no breast tenderness or nipple discharge. (exam performed in the presence of a chaperone)  LABORATORY DATA:  I have reviewed the data as listed CMP Latest Ref Rng & Units 12/02/2016 05/19/2016 12/19/2014  Glucose 65 - 99 mg/dL 155(H) 92 88  BUN 6 - 20 mg/dL 15 18 15   Creatinine 0.44 - 1.00 mg/dL 0.86 0.77 0.77  Sodium 135 - 145 mmol/L 135 133(L) 136  Potassium 3.5 - 5.1 mmol/L 4.2 4.2 4.1  Chloride 101 - 111 mmol/L 96(L) 90(L) 101  CO2 22 - 32 mmol/L 30 26 25   Calcium 8.9 - 10.3 mg/dL 9.3 9.3 9.0  Total Protein 6.1 - 8.1 g/dL - - 6.9  Total Bilirubin 0.2 - 1.2 mg/dL - - 0.5  Alkaline Phos 33 - 130 U/L - - 83  AST 10 - 35 U/L - - 18  ALT 6 - 29 U/L - - 14    Lab Results  Component Value Date   WBC 6.1 12/02/2016   HGB 13.1 12/02/2016   HCT 38.8 12/02/2016   MCV 88.4 12/02/2016   PLT 179 12/02/2016   NEUTROABS 4.1  04/19/2014    ASSESSMENT & PLAN:  Breast cancer of upper-outer quadrant of right female breast Right breast high-grade DCIS with calcifications and comedonecrosis ER PR Negative Right Lumpectomy: DCIS 1.3 cm, Er 0%, PR 0% with calcifications Adjuvant radiation 06/14/2014 to 07/12/2014 No role of antiestrogen therapy since she is ER/PR negative  Breast Cancer Surveillance: 1. Breast exam 06/01/2017: No palpable lumps or nodules   2. Mammogram Done at [facility] 05/16/2017 is normal  Patient wishes to follow-up with her primary care physician for her breast exams.  We are happy to see the patient on an as-needed basis.    No orders of the defined types were placed in this encounter.  The patient has a good understanding of the overall plan. she agrees with it. she will call with any problems that may develop before the next visit  here.   Harriette Ohara, MD 06/01/17

## 2017-07-21 ENCOUNTER — Encounter (INDEPENDENT_AMBULATORY_CARE_PROVIDER_SITE_OTHER): Payer: Self-pay

## 2017-07-21 ENCOUNTER — Encounter: Payer: Self-pay | Admitting: Cardiovascular Disease

## 2017-07-21 ENCOUNTER — Ambulatory Visit: Payer: Medicare Other | Admitting: Cardiovascular Disease

## 2017-07-21 VITALS — BP 132/75 | HR 84 | Ht 64.0 in | Wt 196.0 lb

## 2017-07-21 DIAGNOSIS — I10 Essential (primary) hypertension: Secondary | ICD-10-CM | POA: Diagnosis not present

## 2017-07-21 NOTE — Patient Instructions (Signed)
Medication Instructions:  Your physician has recommended you make the following change in your medication:   STOP Aspirin   Labwork: None Ordered   Testing/Procedures: None Ordered   Follow-Up: Your physician wants you to follow-up in: 1 year with Dr. Acie Fredrickson. You will receive a reminder letter in the mail two months in advance. If you don't receive a letter, please call our office to schedule the follow-up appointment.   If you need a refill on your cardiac medications before your next appointment, please call your pharmacy.   Thank you for choosing [facility]! Christen Bame, RN [phone]

## 2017-07-21 NOTE — Progress Notes (Signed)
[name] Date of Birth  January 05, 1936 [facility] [address]    [place], [place]  [address] [phone]  Fax  [phone]  Problem List 1. Hypertension 2. Hyperlipidemia 3. Sleep apnea 4. Breast Cancer       82 yo with a hx of presyncope, hyperlipidemia, sleep apnea.  She has been under a lot of stress recently. Her sister was diagnosed with stage III inflammatory breast cancer. She has been going to [facility] a regular basis to help her sister with chemotherapy and other related issues.  She denies any chest pain, shortness breath, syncope, or presyncope.  She has a history of hypercholesterolemia. Her cholesterol levels are followed by Dr. Joylene Draft.  She brought her blood pressure log with her today. All of her readings are acceptable.  She brought labs were drawn at Dr. Lindell Noe his office. All of her numbers are quite good. Her a protein B. level is 67 which is quite good. Her total cholesterol is 150. The triglyceride level is 129. The HDL is 42. Her LDL is 82.  She's not had any episodes of chest pain or shortness breath.  Nov. 17, 2014: Feeling well.  BP at home is usually normal.  Still traveling lots - helping her sister with breast cancer.  Exercising a bit. tries to walk in the neighborhood.   Nov. 24, 2015:  Mahayla is doing well.  Tries to exercise.  BP readings are ok No CP or dyspnea.   Nov. 28, 2016:    Has been diagnosed with right breast breast cancer ( DCIS, stage 0)  Had lumpectomy and 20 XRT treatments. Has had left shoulder problems  Was also found to have Meralgia Paresthetica  - affects her sciatic nerve and left leg pain and tingling .  Is now getting back some energy   April 22, 2016:  Doing well.   BP has been a little elevated   Feeling well  Labs from [facility] look good.  Her total cholesterol is 133.  The triglyceride level is 94.  HDL level is 41.  The LDL is 73.  Nov. 15, 2018:    Doing  ok We started [place] last visit.   Thinks her vision is not as good ,  More blurred vision Walks on occasion  July 21, 2017: Doing well . Taking her BP at home  -  BP is well controlled at home  No CP or dyspnea.  Lipids managed by Dr. Joylene Draft  Is on [place] - tolerating it well    Current Outpatient Medications on File Prior to Visit  Medication Sig Dispense Refill  . aspirin 81 MG tablet Take 1 tablet (81 mg total) by mouth 3 (three) times a week. 30 tablet   . atorvastatin (LIPITOR) 10 MG tablet Take 10 mg by mouth daily.  1  . cholecalciferol (VITAMIN D) 400 UNITS TABS Take 400 Units by mouth daily.      Marland Kitchen doxepin (SINEQUAN) 50 MG capsule Take 50 mg daily by mouth.    Marland Kitchen ibuprofen (ADVIL,MOTRIN) 100 MG tablet Take 100 mg as needed by mouth for pain.     . Levothyroxine Sodium (SYNTHROID PO) Take 150 mcg by mouth daily.     . Magnesium 200 MG TABS Take 1,000 mg daily by mouth.     . Multiple Vitamin (MULTIVITAMIN) tablet Take 1 tablet by mouth daily.      Marland Kitchen omeprazole (PRILOSEC) 20 MG capsule Take 20 mg by mouth  daily.     . rOPINIRole (REQUIP) 0.5 MG tablet Take 0.5 mg by mouth daily.  0  . telmisartan (MICARDIS) 20 MG tablet Take 1 tablet (20 mg total) by mouth daily.    . temazepam (RESTORIL) 15 MG capsule Take 15 mg by mouth at bedtime as needed for sleep.    . hydrochlorothiazide (HYDRODIURIL) 25 MG tablet Take 1 tablet (25 mg total) by mouth daily. 30 tablet 11   No current facility-administered medications on file prior to visit.     Allergies  Allergen Reactions  . Latex Hives and Swelling  . Shellfish-Derived Products Nausea And Vomiting  . Simvastatin-High Dose Other (See Comments)    Muscle aches, flu like symptoms.     Past Medical History:  Diagnosis Date  . Breast cancer ([place])   . GERD (gastroesophageal reflux disease)   . Hyperlipidemia   . Hypothyroidism   . Indigestion   . S/P radiation therapy 06/14/14-07/12/14   right breast 50Gy total dose  . Sleep  apnea    CPAP  . Syncope and collapse 2006  . Wears glasses   . Wears partial dentures     Past Surgical History:  Procedure Laterality Date  . BREAST LUMPECTOMY WITH RADIOACTIVE SEED LOCALIZATION Right 05/18/2014   Procedure: BREAST LUMPECTOMY WITH RADIOACTIVE SEED LOCALIZATION;  Surgeon: Rolm Bookbinder, MD;  Location: [facility];  Service: General;  Laterality: Right;  . CARDIOVASCULAR STRESS TEST  12/17/2004   EF 73%. NO EVIDENCE OF ISCHEMIA  . CARPAL TUNNEL RELEASE     LEFT HAND  . COLONOSCOPY    . DILATION AND CURETTAGE OF UTERUS     x2  . TUBAL LIGATION    . US ECHOCARDIOGRAPHY  12/12/2004   EF 55-60%    Social History   Tobacco Use  Smoking Status Former Smoker  . Last attempt to quit: 01/27/1970  . Years since quitting: 47.5  Smokeless Tobacco Never Used    Social History   Substance and Sexual Activity  Alcohol Use No    Family History  Problem Relation Age of Onset  . Coronary artery disease Father   . Heart attack Father   . Breast cancer Sister   . Lymphoma Brother   . Breast cancer Paternal Grandmother   . Colon cancer Neg Hx   . Stomach cancer Neg Hx     Reviw of Systems:  Noted in current history, otherwise review of systems is negative.  Physical Exam: Blood pressure 132/75, pulse 84, height 5\' 4"  (1.626 m), weight 196 lb (88.9 kg), SpO2 96 %.  GEN:  Well nourished, well developed in no acute distress HEENT: Normal NECK: No JVD; No carotid bruits LYMPHATICS: No lymphadenopathy CARDIAC: RR RESPIRATORY:  Clear to auscultation without rales, wheezing or rhonchi  ABDOMEN: Soft, non-tender, non-distended MUSCULOSKELETAL:  No edema; No deformity  SKIN: Warm and dry NEUROLOGIC:  Alert and oriented x 3  ECG:     Assessment / Plan:   1. Hypertension -     BP is well controlled.   Tolerating meds. Will see her in a year  Continue diet , exercise, medications   2. Hyperlipidemia-  Managed by Dr. Joylene Draft    3/. Sleep  apnea  4. Weakness:     5.  ASA therapy - no hx of MI or CVA ,   She may stop her ASA.      Mertie Moores, MD  07/21/2017 10:41 AM    [facility]  [address]  [place], [place]  [address] Pager [phone] Phone: [phone]; Fax: [phone]

## 2017-07-28 ENCOUNTER — Other Ambulatory Visit: Payer: Self-pay | Admitting: Cardiovascular Disease

## 2017-07-28 MED ORDER — HYDROCHLOROTHIAZIDE 25 MG PO TABS: 25.0000 mg | ORAL_TABLET | Freq: Every day | ORAL | 3 refills | Status: DC

## 2017-09-17 DIAGNOSIS — D649 Anemia, unspecified: Secondary | ICD-10-CM | POA: Insufficient documentation

## 2017-09-17 DIAGNOSIS — H6120 Impacted cerumen, unspecified ear: Secondary | ICD-10-CM | POA: Insufficient documentation

## 2018-07-26 ENCOUNTER — Other Ambulatory Visit: Payer: Self-pay | Admitting: Cardiovascular Disease

## 2018-08-30 ENCOUNTER — Ambulatory Visit: Payer: Medicare Other | Admitting: Cardiovascular Disease

## 2018-10-26 DIAGNOSIS — M899 Disorder of bone, unspecified: Secondary | ICD-10-CM | POA: Insufficient documentation

## 2018-10-28 ENCOUNTER — Other Ambulatory Visit: Payer: Self-pay | Admitting: Cardiovascular Disease

## 2018-10-28 ENCOUNTER — Encounter (INDEPENDENT_AMBULATORY_CARE_PROVIDER_SITE_OTHER): Payer: Self-pay

## 2018-10-28 ENCOUNTER — Encounter: Payer: Self-pay | Admitting: Cardiovascular Disease

## 2018-10-28 ENCOUNTER — Ambulatory Visit: Payer: Medicare Other | Admitting: Cardiovascular Disease

## 2018-10-28 ENCOUNTER — Other Ambulatory Visit: Payer: Self-pay

## 2018-10-28 VITALS — BP 122/78 | HR 96 | Ht 63.0 in | Wt 193.4 lb

## 2018-10-28 DIAGNOSIS — E7800 Pure hypercholesterolemia, unspecified: Secondary | ICD-10-CM

## 2018-10-28 DIAGNOSIS — I10 Essential (primary) hypertension: Secondary | ICD-10-CM

## 2018-10-28 NOTE — Patient Instructions (Signed)

## 2018-10-28 NOTE — Progress Notes (Signed)
Sydney Wood Date of Birth  [birth year] [facility] [address]    [place], [place]  [address] [phone]  Fax  [phone]  Problem List 1. Hypertension 2. Hyperlipidemia 3. Sleep apnea 4. Breast Cancer    83 yo with a hx of presyncope, hyperlipidemia, sleep apnea.  She has been under a lot of stress recently. Her sister was diagnosed with stage III inflammatory breast cancer. She has been going to [facility] a regular basis to help her sister with chemotherapy and other related issues.  She denies any chest pain, shortness breath, syncope, or presyncope.  She has a history of hypercholesterolemia. Her cholesterol levels are followed by Dr. Joylene Draft.  She brought her blood pressure log with her today. All of her readings are acceptable.  She brought labs were drawn at Dr. Lindell Noe his office. All of her numbers are quite good. Her a protein B. level is 67 which is quite good. Her total cholesterol is 150. The triglyceride level is 129. The HDL is 42. Her LDL is 82.  She's not had any episodes of chest pain or shortness breath.  Nov. 17, 2014: Feeling well.  BP at home is usually normal.  Still traveling lots - helping her sister with breast cancer.  Exercising a bit. tries to walk in the neighborhood.   Nov. 24, 2015:  Sydney Wood is doing well.  Tries to exercise.  BP readings are ok No CP or dyspnea.   Nov. 28, 2016:    Has been diagnosed with right breast breast cancer ( DCIS, stage 0)  Had lumpectomy and 20 XRT treatments. Has had left shoulder problems  Was also found to have Meralgia Paresthetica  - affects her sciatic nerve and left leg pain and tingling .  Is now getting back some energy   April 22, 2016:  Doing well.   BP has been a little elevated   Feeling well  Labs from [facility] look good.  Her total cholesterol is 133.  The triglyceride level is 94.  HDL level is 41.  The LDL is 73.  Nov. 15, 2018:    Doing  ok We started [place] last visit.   Thinks her vision is not as good ,  More blurred vision Walks on occasion  July 21, 2017: Doing well . Taking her BP at home  -  BP is well controlled at home  No CP or dyspnea.  Lipids managed by Dr. Joylene Draft  Is on [facility] - tolerating it well   Oct. 1, 2020   Sydney Wood is seen today for follow-up of her hypertension and hyperlipidemia.  Recently saw Dr. Joylene Draft,  BP was up so he increased the Telmasartan  Feeling well,  Not exercising much Her TSH was low so her synthroid dose was reduced.  ( which may explain her HR being a bit high)  Current Outpatient Medications on File Prior to Visit  Medication Sig Dispense Refill  . atorvastatin (LIPITOR) 10 MG tablet Take 10 mg by mouth daily.  1  . cholecalciferol (VITAMIN D) 400 UNITS TABS Take 400 Units by mouth daily.      Marland Kitchen doxepin (SINEQUAN) 50 MG capsule Take 50 mg daily by mouth.    . hydrochlorothiazide (HYDRODIURIL) 25 MG tablet Take 1 tablet (25 mg total) by mouth daily. Please keep upcoming appt for future refills. Thank you 90 tablet 0  . ibuprofen (ADVIL,MOTRIN) 100 MG tablet Take 100 mg as needed by mouth for pain.     Marland Kitchen  Levothyroxine Sodium (SYNTHROID PO) Take 150 mcg by mouth daily.     . Magnesium 200 MG TABS Take 1,000 mg daily by mouth.     . Multiple Vitamin (MULTIVITAMIN) tablet Take 1 tablet by mouth daily.      Marland Kitchen omeprazole (PRILOSEC) 20 MG capsule Take 20 mg by mouth daily.     Marland Kitchen rOPINIRole (REQUIP) 0.5 MG tablet Take 0.5 mg by mouth daily.  0  . telmisartan (MICARDIS) 40 MG tablet Take 40 mg by mouth daily.    . temazepam (RESTORIL) 15 MG capsule Take 15 mg by mouth at bedtime as needed for sleep.     No current facility-administered medications on file prior to visit.     Allergies  Allergen Reactions  . Latex Hives and Swelling  . Shellfish-Derived Products Nausea And Vomiting  . Simvastatin-High Dose Other (See Comments)    Muscle aches, flu like symptoms.     Past  Medical History:  Diagnosis Date  . Breast cancer ([place])   . GERD (gastroesophageal reflux disease)   . Hyperlipidemia   . Hypothyroidism   . Indigestion   . S/P radiation therapy 06/14/14-07/12/14   right breast 50Gy total dose  . Sleep apnea    CPAP  . Syncope and collapse 2006  . Wears glasses   . Wears partial dentures     Past Surgical History:  Procedure Laterality Date  . BREAST LUMPECTOMY WITH RADIOACTIVE SEED LOCALIZATION Right 05/18/2014   Procedure: BREAST LUMPECTOMY WITH RADIOACTIVE SEED LOCALIZATION;  Surgeon: Rolm Bookbinder, MD;  Location: [facility];  Service: General;  Laterality: Right;  . CARDIOVASCULAR STRESS TEST  12/17/2004   EF 73%. NO EVIDENCE OF ISCHEMIA  . CARPAL TUNNEL RELEASE     LEFT HAND  . COLONOSCOPY    . DILATION AND CURETTAGE OF UTERUS     x2  . TUBAL LIGATION    . US ECHOCARDIOGRAPHY  12/12/2004   EF 55-60%    Social History   Tobacco Use  Smoking Status Former Smoker  . Quit date: 01/27/1970  . Years since quitting: 48.7  Smokeless Tobacco Never Used    Social History   Substance and Sexual Activity  Alcohol Use No    Family History  Problem Relation Age of Onset  . Coronary artery disease Father   . Heart attack Father   . Breast cancer Sister   . Lymphoma Brother   . Breast cancer Paternal Grandmother   . Colon cancer Neg Hx   . Stomach cancer Neg Hx     Reviw of Systems:  Noted in current history, otherwise review of systems is negative.   Physical Exam: Blood pressure 122/78, pulse 96, height 5\' 3"  (1.6 m), weight 193 lb 6.4 oz (87.7 kg), SpO2 94 %.  GEN:   Elderly female  HEENT: Normal NECK: No JVD; No carotid bruits LYMPHATICS: No lymphadenopathy CARDIAC: RRR  , slightly hyperdynamic  RESPIRATORY:  Clear to auscultation without rales, wheezing or rhonchi  ABDOMEN: Soft, non-tender, non-distended MUSCULOSKELETAL:  No edema; No deformity  SKIN: Warm and dry NEUROLOGIC:  Alert and oriented x  3   ECG:     October 28, 2018: Normal sinus rhythm at 96.  Right axis deviation.  Nonspecific ST and T wave changes.  Assessment / Plan:   1. Hypertension -     blood pressure is well controlled.  Her cardiac sounds are somewhat hyperdynamic.  This is likely due to her mild hyperthyroidism.  Her Synthroid  dose was recently decreased.  2. Hyperlipidemia-her lipid levels were recently drawn by Dr. Rulon Abide .  She does not have her exact levels but says that everything was well controlled.   3/. Sleep apnea  4. Weakness:    Following      Mertie Moores, MD  10/28/2018 12:08 PM    [place],  [place], [place]  [address] Pager [phone] Phone: [phone]; Fax: [phone]

## 2019-02-06 ENCOUNTER — Ambulatory Visit: Payer: Medicare Other | Attending: Internal Medicine

## 2019-02-06 DIAGNOSIS — Z23 Encounter for immunization: Secondary | ICD-10-CM

## 2019-02-06 NOTE — Progress Notes (Signed)
   Covid-19 Vaccination Clinic  Name:  Sydney Wood    MRN: [id number] DOB: November 15, 1935  02/06/2019  Ms. Przybylski was observed post Covid-19 immunization for 30 minutes based on pre-vaccination screening without incidence. She was provided with Vaccine Information Sheet and instruction to access the V-Safe system.   Ms. Baillargeon was instructed to call 911 with any severe reactions post vaccine: Marland Kitchen Difficulty breathing  . Swelling of your face and throat  . A fast heartbeat  . A bad rash all over your body  . Dizziness and weakness    Immunizations Administered    Name Date Dose VIS Date Route   Pfizer COVID-19 Vaccine 02/06/2019  1:58 PM 0.3 mL 01/07/2019 Intramuscular   Manufacturer: [employer], [employer]   Lot: [id number]   [place]: [id number]

## 2019-02-25 ENCOUNTER — Ambulatory Visit: Payer: Medicare PPO

## 2019-02-26 ENCOUNTER — Ambulatory Visit: Payer: Medicare PPO | Attending: Internal Medicine

## 2019-02-26 DIAGNOSIS — Z23 Encounter for immunization: Secondary | ICD-10-CM | POA: Insufficient documentation

## 2019-02-26 NOTE — Progress Notes (Signed)
   Covid-19 Vaccination Clinic  Name:  Sydney Wood    MRN: [id number] DOB: 30-Jun-1935  02/26/2019  [name] was observed post Covid-19 immunization for 15 minutes without incidence. She was provided with Vaccine Information Sheet and instruction to access the V-Safe system.   [name] was instructed to call 911 with any severe reactions post vaccine: Marland Kitchen Difficulty breathing  . Swelling of your face and throat  . A fast heartbeat  . A bad rash all over your body  . Dizziness and weakness    Immunizations Administered    Name Date Dose VIS Date Route   Pfizer COVID-19 Vaccine 02/26/2019 12:00 PM 0.3 mL 01/07/2019 Intramuscular   Manufacturer: [place]   Lot: [id number]   [place]: [id number]

## 2019-04-28 DIAGNOSIS — Z853 Personal history of malignant neoplasm of breast: Secondary | ICD-10-CM | POA: Insufficient documentation

## 2019-06-30 DIAGNOSIS — Z853 Personal history of malignant neoplasm of breast: Secondary | ICD-10-CM | POA: Diagnosis not present

## 2019-06-30 DIAGNOSIS — R928 Other abnormal and inconclusive findings on diagnostic imaging of breast: Secondary | ICD-10-CM | POA: Diagnosis not present

## 2019-06-30 DIAGNOSIS — N6489 Other specified disorders of breast: Secondary | ICD-10-CM | POA: Diagnosis not present

## 2019-07-13 ENCOUNTER — Other Ambulatory Visit: Payer: Self-pay | Admitting: Radiology

## 2019-07-13 DIAGNOSIS — D242 Benign neoplasm of left breast: Secondary | ICD-10-CM | POA: Diagnosis not present

## 2019-07-13 DIAGNOSIS — N6324 Unspecified lump in the left breast, lower inner quadrant: Secondary | ICD-10-CM | POA: Diagnosis not present

## 2019-10-25 DIAGNOSIS — I10 Essential (primary) hypertension: Secondary | ICD-10-CM | POA: Diagnosis not present

## 2019-10-25 DIAGNOSIS — R7301 Impaired fasting glucose: Secondary | ICD-10-CM | POA: Diagnosis not present

## 2019-10-25 DIAGNOSIS — E039 Hypothyroidism, unspecified: Secondary | ICD-10-CM | POA: Diagnosis not present

## 2019-10-25 DIAGNOSIS — E785 Hyperlipidemia, unspecified: Secondary | ICD-10-CM | POA: Diagnosis not present

## 2019-10-25 DIAGNOSIS — M859 Disorder of bone density and structure, unspecified: Secondary | ICD-10-CM | POA: Diagnosis not present

## 2019-10-30 ENCOUNTER — Encounter: Payer: Self-pay | Admitting: Cardiovascular Disease

## 2019-10-30 NOTE — Progress Notes (Signed)
Sydney Wood Date of Birth  05-19-1935 [facility] [address]    [place], [place]  [address]    Problem List 1. Hypertension 2. Hyperlipidemia 3. Sleep apnea 4. Breast Cancer    84 yo with a hx of presyncope, hyperlipidemia, sleep apnea.  She has been under a lot of stress recently. Her sister was diagnosed with stage III inflammatory breast cancer. She has been going to [facility] a regular basis to help her sister with chemotherapy and other related issues.  She denies any chest pain, shortness breath, syncope, or presyncope.  She has a history of hypercholesterolemia. Her cholesterol levels are followed by Dr. Joylene Draft.  She brought her blood pressure log with her today. All of her readings are acceptable.  She brought labs were drawn at Dr. Lindell Noe his office. All of her numbers are quite good. Her a protein B. level is 67 which is quite good. Her total cholesterol is 150. The triglyceride level is 129. The HDL is 42. Her LDL is 82.  She's not had any episodes of chest pain or shortness breath.  Nov. 17, 2014: Feeling well.  BP at home is usually normal.  Still traveling lots - helping her sister with breast cancer.  Exercising a bit. tries to walk in the neighborhood.   Nov. 24, 2015:  Sydney Wood is doing well.  Tries to exercise.  BP readings are ok No CP or dyspnea.   Nov. 28, 2016:    Has been diagnosed with right breast breast cancer ( DCIS, stage 0)  Had lumpectomy and 20 XRT treatments. Has had left shoulder problems  Was also found to have Meralgia Paresthetica  - affects her sciatic nerve and left leg pain and tingling .  Is now getting back some energy   April 22, 2016:  Doing well.   BP has been a little elevated   Feeling well  Labs from [facility] look good.  Her total cholesterol is 133.  The triglyceride level is 94.  HDL level is 41.  The LDL is 73.  Nov. 15, 2018:    Doing ok We started [place] last visit.    Thinks her vision is not as good ,  More blurred vision Walks on occasion  July 21, 2017: Doing well . Taking her BP at home  -  BP is well controlled at home  No CP or dyspnea.  Lipids managed by Dr. Joylene Draft  Is on [facility] - tolerating it well   Oct. 1, 2020   Sydney Wood is seen today for follow-up of her hypertension and hyperlipidemia.  Recently saw Dr. Joylene Draft,  BP was up so he increased the Telmasartan  Feeling well,  Not exercising much Her TSH was low so her synthroid dose was reduced.  ( which may explain her HR being a bit high)   Oct. 4, 2021: Sydney Wood is seen today for follow up of her HTN and HLD  BP is normal at home  No CP or dyspnea Has some DOE,  Dose not exercise .   She tries to walk on occasion  Sees Dr. Joylene Draft for her lipid management.     Current Outpatient Medications on File Prior to Visit  Medication Sig Dispense Refill  . atorvastatin (LIPITOR) 10 MG tablet Take 10 mg by mouth daily.  1  . cholecalciferol (VITAMIN D) 400 UNITS TABS Take 400 Units by mouth daily.      Marland Kitchen doxepin (SINEQUAN) 50 MG capsule  Take 50 mg daily by mouth.    . hydrochlorothiazide (HYDRODIURIL) 25 MG tablet Take 1 tablet (25 mg total) by mouth daily. 90 tablet 3  . ibuprofen (ADVIL,MOTRIN) 100 MG tablet Take 100 mg as needed by mouth for pain.     . Levothyroxine Sodium (SYNTHROID PO) Take 150 mcg by mouth daily.     . Magnesium 200 MG TABS Take 1,000 mg daily by mouth.     . Multiple Vitamin (MULTIVITAMIN) tablet Take 1 tablet by mouth daily.      Marland Kitchen omeprazole (PRILOSEC) 20 MG capsule Take 20 mg by mouth daily.     Marland Kitchen rOPINIRole (REQUIP) 0.5 MG tablet Take 0.5 mg by mouth daily.  0  . telmisartan (MICARDIS) 20 MG tablet Take 20 mg by mouth daily.     . temazepam (RESTORIL) 15 MG capsule Take 15 mg by mouth at bedtime as needed for sleep.     No current facility-administered medications on file prior to visit.    Allergies  Allergen Reactions  . Latex Hives and Swelling  .  Shellfish-Derived Products Nausea And Vomiting  . Simvastatin-High Dose Other (See Comments)    Muscle aches, flu like symptoms.     Past Medical History:  Diagnosis Date  . Breast cancer ([place])   . GERD (gastroesophageal reflux disease)   . Hyperlipidemia   . Hypothyroidism   . Indigestion   . S/P radiation therapy 06/14/14-07/12/14   right breast 50Gy total dose  . Sleep apnea    CPAP  . Syncope and collapse 2006  . Wears glasses   . Wears partial dentures     Past Surgical History:  Procedure Laterality Date  . BREAST LUMPECTOMY WITH RADIOACTIVE SEED LOCALIZATION Right 05/18/2014   Procedure: BREAST LUMPECTOMY WITH RADIOACTIVE SEED LOCALIZATION;  Surgeon: Rolm Bookbinder, MD;  Location: [facility];  Service: General;  Laterality: Right;  . CARDIOVASCULAR STRESS TEST  12/17/2004   EF 73%. NO EVIDENCE OF ISCHEMIA  . CARPAL TUNNEL RELEASE     LEFT HAND  . COLONOSCOPY    . DILATION AND CURETTAGE OF UTERUS     x2  . TUBAL LIGATION    . US ECHOCARDIOGRAPHY  12/12/2004   EF 55-60%    Social History   Tobacco Use  Smoking Status Former Smoker  . Quit date: 01/27/1970  . Years since quitting: 49.7  Smokeless Tobacco Never Used    Social History   Substance and Sexual Activity  Alcohol Use No    Family History  Problem Relation Age of Onset  . Coronary artery disease Father   . Heart attack Father   . Breast cancer Sister   . Lymphoma Brother   . Breast cancer Paternal Grandmother   . Colon cancer Neg Hx   . Stomach cancer Neg Hx     Reviw of Systems:  Noted in current history, otherwise review of systems is negative.  Physical Exam: Blood pressure 134/78, pulse 64, height 5\' 3"  (1.6 m), weight 195 lb 3.2 oz (88.5 kg), SpO2 97 %.  GEN:  Elderly female, , well developed in no acute distress HEENT: Normal NECK: No JVD; No carotid bruits LYMPHATICS: No lymphadenopathy CARDIAC: RRR , no murmurs, rubs, gallops RESPIRATORY:  Clear to  auscultation without rales, wheezing or rhonchi  ABDOMEN: Soft, non-tender, non-distended MUSCULOSKELETAL:  No edema; No deformity  SKIN: Warm and dry NEUROLOGIC:  Alert and oriented x 3    ECG:     October 31, 2019:  Normal sinus rhythm at 64.  No ST or T wave changes.  Normal EKG.  Assessment / Plan:   1. Hypertension -     blood pressure is well controlled.  Continue current medications.  2. Hyperlipidemia- lipids are managed by her primary medical doctor.  Continue current medications.         Mertie Moores, MD  10/31/2019 3:52 PM    [facility] [place],  [place], [place]  [address] Pager [phone] Phone: [phone]; Fax: [phone]

## 2019-10-31 ENCOUNTER — Other Ambulatory Visit: Payer: Self-pay

## 2019-10-31 ENCOUNTER — Ambulatory Visit (INDEPENDENT_AMBULATORY_CARE_PROVIDER_SITE_OTHER): Payer: Medicare PPO | Admitting: Cardiovascular Disease

## 2019-10-31 ENCOUNTER — Encounter: Payer: Self-pay | Admitting: Cardiovascular Disease

## 2019-10-31 VITALS — BP 134/78 | HR 64 | Ht 63.0 in | Wt 195.2 lb

## 2019-10-31 DIAGNOSIS — E7800 Pure hypercholesterolemia, unspecified: Secondary | ICD-10-CM

## 2019-10-31 DIAGNOSIS — I10 Essential (primary) hypertension: Secondary | ICD-10-CM

## 2019-10-31 DIAGNOSIS — E782 Mixed hyperlipidemia: Secondary | ICD-10-CM | POA: Diagnosis not present

## 2019-10-31 NOTE — Patient Instructions (Signed)
Medication Instructions:  Your provider recommends that you continue on your current medications as directed. Please refer to the Current Medication list given to you today.   *If you need a refill on your cardiac medications before your next appointment, please call your pharmacy*   Follow-Up: At [facility], you and your health needs are our priority.  As part of our continuing mission to provide you with exceptional heart care, we have created designated Provider Care Teams.  These Care Teams include your primary Cardiologist (physician) and Advanced Practice Providers (APPs -  Physician Assistants and Nurse Practitioners) who all work together to provide you with the care you need, when you need it. Your next appointment:   12 month(s) The format for your next appointment:   In Person Provider:   You will see one of the following Advanced Practice Providers on your designated Care Team:    Richardson Dopp, PA-C  [place], [place]

## 2019-11-01 ENCOUNTER — Ambulatory Visit: Payer: Medicare PPO | Attending: Internal Medicine

## 2019-11-01 DIAGNOSIS — M8580 Other specified disorders of bone density and structure, unspecified site: Secondary | ICD-10-CM | POA: Diagnosis not present

## 2019-11-01 DIAGNOSIS — G4733 Obstructive sleep apnea (adult) (pediatric): Secondary | ICD-10-CM | POA: Diagnosis not present

## 2019-11-01 DIAGNOSIS — R7301 Impaired fasting glucose: Secondary | ICD-10-CM | POA: Diagnosis not present

## 2019-11-01 DIAGNOSIS — R82998 Other abnormal findings in urine: Secondary | ICD-10-CM | POA: Diagnosis not present

## 2019-11-01 DIAGNOSIS — D649 Anemia, unspecified: Secondary | ICD-10-CM | POA: Diagnosis not present

## 2019-11-01 DIAGNOSIS — E039 Hypothyroidism, unspecified: Secondary | ICD-10-CM | POA: Diagnosis not present

## 2019-11-01 DIAGNOSIS — Z853 Personal history of malignant neoplasm of breast: Secondary | ICD-10-CM | POA: Diagnosis not present

## 2019-11-01 DIAGNOSIS — E785 Hyperlipidemia, unspecified: Secondary | ICD-10-CM | POA: Diagnosis not present

## 2019-11-01 DIAGNOSIS — Z0000 Encounter for general adult medical examination without abnormal findings: Secondary | ICD-10-CM | POA: Diagnosis not present

## 2019-11-01 DIAGNOSIS — Z23 Encounter for immunization: Secondary | ICD-10-CM

## 2019-11-01 DIAGNOSIS — I10 Essential (primary) hypertension: Secondary | ICD-10-CM | POA: Diagnosis not present

## 2019-11-01 NOTE — Progress Notes (Signed)
   Covid-19 Vaccination Clinic  Name:  BROOKLIN RIEGER    MRN: [id number] DOB: 08-24-1935  11/01/2019  Ms. Dehne was observed post Covid-19 immunization for 15 minutes without incident. She was provided with Vaccine Information Sheet and instruction to access the V-Safe system.   Ms. Gwynne was instructed to call 911 with any severe reactions post vaccine: Marland Kitchen Difficulty breathing  . Swelling of face and throat  . A fast heartbeat  . A bad rash all over body  . Dizziness and weakness

## 2019-11-03 ENCOUNTER — Other Ambulatory Visit: Payer: Self-pay | Admitting: Cardiovascular Disease

## 2019-11-26 DIAGNOSIS — Z23 Encounter for immunization: Secondary | ICD-10-CM | POA: Diagnosis not present

## 2019-12-07 DIAGNOSIS — R42 Dizziness and giddiness: Secondary | ICD-10-CM | POA: Diagnosis not present

## 2019-12-07 DIAGNOSIS — I10 Essential (primary) hypertension: Secondary | ICD-10-CM | POA: Diagnosis not present

## 2019-12-07 DIAGNOSIS — J019 Acute sinusitis, unspecified: Secondary | ICD-10-CM | POA: Diagnosis not present

## 2019-12-07 DIAGNOSIS — E871 Hypo-osmolality and hyponatremia: Secondary | ICD-10-CM | POA: Diagnosis not present

## 2019-12-09 DIAGNOSIS — G4733 Obstructive sleep apnea (adult) (pediatric): Secondary | ICD-10-CM | POA: Diagnosis not present

## 2019-12-28 DIAGNOSIS — H5203 Hypermetropia, bilateral: Secondary | ICD-10-CM | POA: Diagnosis not present

## 2019-12-28 DIAGNOSIS — H35433 Paving stone degeneration of retina, bilateral: Secondary | ICD-10-CM | POA: Diagnosis not present

## 2019-12-28 DIAGNOSIS — H04123 Dry eye syndrome of bilateral lacrimal glands: Secondary | ICD-10-CM | POA: Diagnosis not present

## 2019-12-28 DIAGNOSIS — H35363 Drusen (degenerative) of macula, bilateral: Secondary | ICD-10-CM | POA: Diagnosis not present

## 2019-12-28 DIAGNOSIS — H25813 Combined forms of age-related cataract, bilateral: Secondary | ICD-10-CM | POA: Diagnosis not present

## 2019-12-28 DIAGNOSIS — H524 Presbyopia: Secondary | ICD-10-CM | POA: Diagnosis not present

## 2019-12-28 DIAGNOSIS — I10 Essential (primary) hypertension: Secondary | ICD-10-CM | POA: Diagnosis not present

## 2019-12-28 DIAGNOSIS — H35033 Hypertensive retinopathy, bilateral: Secondary | ICD-10-CM | POA: Diagnosis not present

## 2019-12-28 DIAGNOSIS — H52223 Regular astigmatism, bilateral: Secondary | ICD-10-CM | POA: Diagnosis not present

## 2020-02-02 DIAGNOSIS — I10 Essential (primary) hypertension: Secondary | ICD-10-CM | POA: Diagnosis not present

## 2020-02-07 ENCOUNTER — Other Ambulatory Visit: Payer: Self-pay | Admitting: Internal Medicine

## 2020-02-07 DIAGNOSIS — R9389 Abnormal findings on diagnostic imaging of other specified body structures: Secondary | ICD-10-CM

## 2020-02-07 DIAGNOSIS — M25511 Pain in right shoulder: Secondary | ICD-10-CM | POA: Diagnosis not present

## 2020-02-13 ENCOUNTER — Other Ambulatory Visit: Payer: Self-pay | Admitting: Cardiovascular Disease

## 2020-02-14 ENCOUNTER — Encounter: Payer: Self-pay | Admitting: Hematology and Oncology

## 2020-02-14 DIAGNOSIS — R928 Other abnormal and inconclusive findings on diagnostic imaging of breast: Secondary | ICD-10-CM | POA: Diagnosis not present

## 2020-02-21 ENCOUNTER — Ambulatory Visit
Admission: RE | Admit: 2020-02-21 | Discharge: 2020-02-21 | Disposition: A | Discharge status: Home / Self Care | Payer: Medicare PPO | Source: Ambulatory Visit | Attending: Internal Medicine | Admitting: Internal Medicine

## 2020-02-21 DIAGNOSIS — J189 Pneumonia, unspecified organism: Secondary | ICD-10-CM | POA: Diagnosis not present

## 2020-02-21 DIAGNOSIS — J479 Bronchiectasis, uncomplicated: Secondary | ICD-10-CM | POA: Diagnosis not present

## 2020-02-21 DIAGNOSIS — R9389 Abnormal findings on diagnostic imaging of other specified body structures: Secondary | ICD-10-CM

## 2020-02-21 DIAGNOSIS — I2510 Atherosclerotic heart disease of native coronary artery without angina pectoris: Secondary | ICD-10-CM | POA: Diagnosis not present

## 2020-02-21 DIAGNOSIS — J8489 Other specified interstitial pulmonary diseases: Secondary | ICD-10-CM | POA: Diagnosis not present

## 2020-02-28 DIAGNOSIS — M25511 Pain in right shoulder: Secondary | ICD-10-CM | POA: Diagnosis not present

## 2020-03-15 DIAGNOSIS — Z1152 Encounter for screening for COVID-19: Secondary | ICD-10-CM | POA: Diagnosis not present

## 2020-03-15 DIAGNOSIS — R058 Other specified cough: Secondary | ICD-10-CM | POA: Diagnosis not present

## 2020-03-15 DIAGNOSIS — J069 Acute upper respiratory infection, unspecified: Secondary | ICD-10-CM | POA: Diagnosis not present

## 2020-04-03 ENCOUNTER — Other Ambulatory Visit: Payer: Self-pay

## 2020-04-03 ENCOUNTER — Ambulatory Visit: Payer: Medicare PPO | Admitting: Pulmonary Disease

## 2020-04-03 ENCOUNTER — Encounter: Payer: Self-pay | Admitting: Pulmonary Disease

## 2020-04-03 VITALS — BP 128/82 | HR 83 | Temp 98.0°F | Ht 63.0 in | Wt 193.0 lb

## 2020-04-03 DIAGNOSIS — R053 Chronic cough: Secondary | ICD-10-CM | POA: Diagnosis not present

## 2020-04-03 DIAGNOSIS — J849 Interstitial pulmonary disease, unspecified: Secondary | ICD-10-CM

## 2020-04-03 MED ORDER — BREO ELLIPTA 200-25 MCG/INH IN AEPB: 1.0000 | INHALATION_SPRAY | Freq: Once | RESPIRATORY_TRACT | 0 refills | Status: AC

## 2020-04-03 MED ORDER — BREO ELLIPTA 200-25 MCG/INH IN AEPB: 1.0000 | INHALATION_SPRAY | Freq: Once | RESPIRATORY_TRACT | 3 refills | Status: AC

## 2020-04-03 NOTE — Patient Instructions (Addendum)
For deep breathing exercises, I can recommend the following video: https://youtu.[email or web address]. If the link does not work, please search "The Breather - Diaphragmatic Breathing Mastery" on YouTube. Try to perform this exercise at least twice a day for 5-10 minutes. Yoga breathing exercises are also appropriate if you have access to those resources. The goal is take effective deep breaths and hold to open up areas of your lungs on daily basis.   ILD --Defer autoimmune work-up --Prior radiation exposure --Start bronchodilator as noted below  Chronic cough secondary ILD --START Breo 200-25 mcg ONE puff ONCE a day --ORDER pulmonary function tests  OSA on CPAP --Continue CPAP at least 4 hours daily --Keep follow-up with Sleep/PCP provider. If you wish to switch, let us know.  Follow-up with me in 3 months

## 2020-04-03 NOTE — Progress Notes (Signed)
Subjective:   PATIENT ID: [name] GENDER: female DOB: 09-29-1935, MRN: [id number]   HPI  Chief Complaint  Patient presents with   Consult    Sob at times. Dry cough at night.     Reason for Visit: New consult for abnormal CT and shortness of breath.  Ms. Deandre Stansel is an 85 year old female with OSA on CPAP, history of right breast cancer s/p lumpectomy and radiation in remission, hypertension, hyperlipidemia, hypothyroidism, reflux who presents as a new consult.  She was referred by her PCP Dr. Joylene Draft on 01/30/2020 during a telephone encounter regarding abnormal CT chest demonstrating extra mucus in the airways and scarring. She was referred to pulmonary.  She reports long standing history of shortness of breath with exertion with short-medium distances. She gets out of breath after walking a flight of stairs. She will need to rest after walking in the grocery store after 10 minutes. She is unsure of when her symptoms began but it has been gradually worsening and now associated with a cough. She describes having a non-productive cough. No wheezing. Has not measured oxygen levels at home.  She has a diagnosis of sleep apnea and is compliant with CPAP nightly. Feels this helps with her quality of sleep and energy.  Social History: Former smoker. Quit in 1972. Smoked 1/2 in her 57s beginning in college. 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                                                                                                                                                                               Past Medical History:  Diagnosis Date   Breast cancer ([place])    GERD (gastroesophageal reflux disease)    Hyperlipidemia    Hypothyroidism     Indigestion    S/P radiation therapy 06/14/14-07/12/14   right breast 50Gy total dose   Sleep apnea    CPAP   Syncope and collapse 2006   Wears glasses    Wears partial dentures      Family History  Problem Relation Age of Onset   Coronary artery disease Father    Heart attack Father    Breast cancer Sister    Lymphoma Brother    Breast cancer Paternal Grandmother    Colon cancer Neg Hx    Stomach cancer Neg Hx      Social History   Occupational History   Not on file  Tobacco Use   Smoking status: Former Smoker  Packs/day: 0.50    Years: 15.00    Pack years: 7.50    Quit date: 01/27/1970    Years since quitting: 50.2   Smokeless tobacco: Never Used  Vaping Use   Vaping Use: Never used  Substance and Sexual Activity   Alcohol use: No   Drug use: No   Sexual activity: Not on file    Allergies  Allergen Reactions   Latex Hives and Swelling   Shellfish-Derived Products Nausea And Vomiting   Simvastatin-High Dose Other (See Comments)    Muscle aches, flu like symptoms.      Outpatient Medications Prior to Visit  Medication Sig Dispense Refill   atorvastatin (LIPITOR) 10 MG tablet Take 10 mg by mouth daily.  1   cholecalciferol (VITAMIN D) 400 UNITS TABS Take 400 Units by mouth daily.     doxepin (SINEQUAN) 50 MG capsule Take 50 mg daily by mouth.     hydrochlorothiazide (HYDRODIURIL) 25 MG tablet TAKE 1 TABLET(25 MG) BY MOUTH DAILY 90 tablet 2   ibuprofen (ADVIL,MOTRIN) 100 MG tablet Take 100 mg as needed by mouth for pain.      Levothyroxine Sodium (SYNTHROID PO) Take 150 mcg by mouth daily.      Magnesium 200 MG TABS Take 1,000 mg daily by mouth.      Multiple Vitamin (MULTIVITAMIN) tablet Take 1 tablet by mouth daily.     omeprazole (PRILOSEC) 20 MG capsule Take 20 mg by mouth daily.     rOPINIRole (REQUIP) 0.5 MG tablet Take 0.5 mg by mouth daily.  0   telmisartan (MICARDIS) 20 MG tablet Take 20 mg by mouth daily.       temazepam (RESTORIL) 15 MG capsule Take 15 mg by mouth at bedtime as needed for sleep.     No facility-administered medications prior to visit.    Review of Systems  Constitutional: Negative for chills, diaphoresis, fever, malaise/fatigue and weight loss.  HENT: Negative for congestion, ear pain and sore throat.   Respiratory: Positive for cough and shortness of breath. Negative for hemoptysis, sputum production and wheezing.   Cardiovascular: Negative for chest pain, palpitations and leg swelling.  Gastrointestinal: Negative for abdominal pain, heartburn and nausea.  Genitourinary: Negative for frequency.  Musculoskeletal: Negative for joint pain and myalgias.  Skin: Negative for itching and rash.  Neurological: Negative for dizziness, weakness and headaches.  Endo/Heme/Allergies: Does not bruise/bleed easily.  Psychiatric/Behavioral: Negative for depression. The patient is not nervous/anxious.      Objective:   Vitals:   04/03/20 1050  BP: 128/82  Pulse: 83  Temp: 98 F (36.7 C)  SpO2: 95%  Weight: 193 lb (87.5 kg)  Height: 5\' 3"  (1.6 m)   SpO2: 95 % O2 Device: None (Room air)  Physical Exam: General: Well-appearing, no acute distress HENT: [place], AT Eyes: EOMI, no scleral icterus Respiratory: Diminished breath sounds bilaterally.  No crackles, wheezing or rales Cardiovascular: RRR, -M/R/G, no JVD Extremities:-Edema,-tenderness Neuro: AAO x4, CNII-XII grossly intact Skin: Intact, no rashes or bruising Psych: Normal mood, normal affect  Data Reviewed:  Imaging: CT chest HR 02/21/2020-basilar and subpleural groundglass with minimal reticulation and single-layer honeycombing notable in the right middle lobe.  Mosaic attenuation.  Traction bronchiectasis present.  Enlarged PA suggestive of pulmonary arterial hypertension.  PFT: None on file  Labs: [place] labs Basic metabolic panel reviewed.  12/07/2019 Sodium 129, glucose 131.  Remaining values  within normal limits CBC reviewed 10/25/2019 WBC 6.35 Absolute eosinophils 100  Assessment & Plan:   Discussion: 85 year old female with OSA on CPAP, history of right breast cancer s/p lumpectomy and radiation in remission who presents for shortness of breath and new non-productive cough. CT imaging with evidence of ILD.Ambulatory O2 in-clinic demonstrates no need for oxygen. Will need to obtain pulmonary function tests to determine severity of her ILD. We discussed work-up including ruling out autoimmune process. She has prior exposure to radiation however bilateral involvement would not be consistent with this.  We discussed clinical course and management of ILD including bronchodilators, deep breathing techniques and treatment of flares. She expresses understanding and would like to defer auto-immune work-up due to her age and focus on symptomatic management.  ILD --Defer autoimmune work-up --Prior radiation exposure --Start bronchodilator as noted below  Chronic cough secondary ILD --START Breo 200-25 mcg ONE puff ONCE a day --ORDER pulmonary function tests  OSA on CPAP --Continue CPAP at least 4 hours daily --Keep follow-up with Sleep/PCP provider. If you wish to switch, let us know.   Health Maintenance Immunization History  Administered Date(s) Administered   Influenza, Quadrivalent, Recombinant, Inj, Pf 09/17/2017, 10/28/2018, 11/26/2019   PFIZER(Purple Top)SARS-COV-2 Vaccination 02/06/2019, 02/26/2019, 11/01/2019   CT Lung Screen - not a candidate. Insufficient smoking history  Orders Placed This Encounter  Procedures   Pulmonary function test    Standing Status:   Future    Standing Expiration Date:   04/03/2021    Order Specific Question:   Where should this test be performed?    Answer:   [facility]    Order Specific Question:   Full PFT: includes the following: basic spirometry, spirometry pre & post bronchodilator, diffusion capacity (DLCO), lung volumes     Answer:   Full PFT   Meds ordered this encounter  Medications   fluticasone furoate-vilanterol (BREO ELLIPTA) 200-25 MCG/INH AEPB    Sig: Inhale 1 puff into the lungs once for 1 dose.    Dispense:  1 each    Refill:  0    Order Specific Question:   Lot Number?    Answer:   Janus Molder    Order Specific Question:   Expiration Date?    Answer:   10/24/2020   fluticasone furoate-vilanterol (BREO ELLIPTA) 200-25 MCG/INH AEPB    Sig: Inhale 1 puff into the lungs once for 1 dose.    Dispense:  1 each    Refill:  3    Return in about 3 months (around 07/04/2020).  I have spent a total time of 45-minutes on the day of the appointment reviewing prior documentation, coordinating care and discussing medical diagnosis and plan with the patient/family. Imaging, labs and tests included in this note have been reviewed and interpreted independently by me.  [place], MD [facility] 04/03/2020 11:10 AM  Office Number [phone]

## 2020-04-04 ENCOUNTER — Encounter: Payer: Self-pay | Admitting: Pulmonary Disease

## 2020-04-04 DIAGNOSIS — J849 Interstitial pulmonary disease, unspecified: Secondary | ICD-10-CM | POA: Insufficient documentation

## 2020-04-04 DIAGNOSIS — R053 Chronic cough: Secondary | ICD-10-CM | POA: Insufficient documentation

## 2020-04-09 ENCOUNTER — Telehealth: Payer: Self-pay | Admitting: Pulmonary Disease

## 2020-04-09 NOTE — Telephone Encounter (Signed)
--  START Breo 200-25 mcg ONE puff ONCE a day  Called and spoke with Aldona Bar at the pharmacy and she is aware of directions for the [facility].  Nothing further is needed.

## 2020-04-16 ENCOUNTER — Telehealth: Payer: Self-pay | Admitting: *Deleted

## 2020-04-16 MED ORDER — BREO ELLIPTA 200-25 MCG/INH IN AEPB: 1.0000 | INHALATION_SPRAY | Freq: Every day | RESPIRATORY_TRACT | 6 refills | Status: DC

## 2020-04-16 NOTE — Telephone Encounter (Signed)
Received fax from the pharmacy about the rx that was sent in for [facility].  They could not process this since there was no sig for the rx.  This was resent to the pharmacy.

## 2020-05-09 ENCOUNTER — Other Ambulatory Visit: Payer: Self-pay

## 2020-05-09 ENCOUNTER — Ambulatory Visit (INDEPENDENT_AMBULATORY_CARE_PROVIDER_SITE_OTHER): Payer: Medicare PPO

## 2020-05-09 ENCOUNTER — Ambulatory Visit: Payer: Medicare PPO | Admitting: Podiatry

## 2020-05-09 ENCOUNTER — Encounter: Payer: Self-pay | Admitting: Podiatry

## 2020-05-09 DIAGNOSIS — M79672 Pain in left foot: Secondary | ICD-10-CM

## 2020-05-09 DIAGNOSIS — L84 Corns and callosities: Secondary | ICD-10-CM

## 2020-05-09 DIAGNOSIS — M79671 Pain in right foot: Secondary | ICD-10-CM

## 2020-05-09 DIAGNOSIS — D169 Benign neoplasm of bone and articular cartilage, unspecified: Secondary | ICD-10-CM

## 2020-05-09 DIAGNOSIS — L600 Ingrowing nail: Secondary | ICD-10-CM | POA: Diagnosis not present

## 2020-05-09 NOTE — Progress Notes (Signed)
Subjective:   Patient ID: Sydney Wood, female   DOB: 85 y.o.   MRN: [id number]   HPI Patient presents stating she has a.  Very painful lesion between her fourth and fifth toes of her right foot with corn and she has a chronic ingrown toenail of the right hallux is been relatively chronic in nature.  Patient does not smoke likes to be active is having trouble wearing shoe gear and has tried padding   Review of Systems  All other systems reviewed and are negative.       Objective:  Physical Exam Vitals and nursing note reviewed.  Constitutional:      Appearance: She is well-developed.  Pulmonary:     Effort: Pulmonary effort is normal.  Musculoskeletal:        General: Normal range of motion.  Skin:    General: Skin is warm.  Neurological:     Mental Status: She is alert.     Neurovascular status found to be intact with range of motion adequate and muscle strength moderately diminished.  Patient has a painful keratotic lesion fifth digit fourth digit right 2 separate lesions with rotation of the toes noted and has a moderate incurvation of the right hallux toenail.  Good digital perfusion well oriented x3     Assessment:  Exostotic lesion digit 5 right hammertoe deformity fourth right and ingrown toenail deformity right     Plan:  H&P reviewed all conditions and discussed at 1 point possibility for exostectomy hammertoe repair of the right and today I went ahead did sterile sharp debridement of each lesion and applied padding to cushion the area.  Depending on response we will decide on surgical intervention I do not recommend correction of the ingrown toenail at this time  X-rays indicate that there is rotation of the fifth digit bilateral pressing against the fourth toe with mild arthritis no other pathology noted

## 2020-05-21 DIAGNOSIS — D649 Anemia, unspecified: Secondary | ICD-10-CM | POA: Diagnosis not present

## 2020-05-21 DIAGNOSIS — E039 Hypothyroidism, unspecified: Secondary | ICD-10-CM | POA: Diagnosis not present

## 2020-05-21 DIAGNOSIS — I10 Essential (primary) hypertension: Secondary | ICD-10-CM | POA: Diagnosis not present

## 2020-05-21 DIAGNOSIS — K219 Gastro-esophageal reflux disease without esophagitis: Secondary | ICD-10-CM | POA: Diagnosis not present

## 2020-05-21 DIAGNOSIS — C50919 Malignant neoplasm of unspecified site of unspecified female breast: Secondary | ICD-10-CM | POA: Diagnosis not present

## 2020-05-21 DIAGNOSIS — F329 Major depressive disorder, single episode, unspecified: Secondary | ICD-10-CM | POA: Diagnosis not present

## 2020-05-21 DIAGNOSIS — G2581 Restless legs syndrome: Secondary | ICD-10-CM | POA: Diagnosis not present

## 2020-05-21 DIAGNOSIS — E785 Hyperlipidemia, unspecified: Secondary | ICD-10-CM | POA: Diagnosis not present

## 2020-05-21 DIAGNOSIS — E162 Hypoglycemia, unspecified: Secondary | ICD-10-CM | POA: Diagnosis not present

## 2020-06-01 ENCOUNTER — Other Ambulatory Visit (HOSPITAL_COMMUNITY): Payer: Medicare PPO

## 2020-06-21 DIAGNOSIS — E785 Hyperlipidemia, unspecified: Secondary | ICD-10-CM | POA: Diagnosis not present

## 2020-06-21 DIAGNOSIS — M8589 Other specified disorders of bone density and structure, multiple sites: Secondary | ICD-10-CM | POA: Diagnosis not present

## 2020-06-26 ENCOUNTER — Other Ambulatory Visit (HOSPITAL_COMMUNITY)
Admission: RE | Admit: 2020-06-26 | Discharge: 2020-06-26 | Disposition: A | Discharge status: Home / Self Care | Payer: Medicare PPO | Source: Ambulatory Visit | Attending: Pulmonary Disease | Admitting: Pulmonary Disease

## 2020-06-26 DIAGNOSIS — Z20822 Contact with and (suspected) exposure to covid-19: Secondary | ICD-10-CM | POA: Diagnosis not present

## 2020-06-26 DIAGNOSIS — Z01812 Encounter for preprocedural laboratory examination: Secondary | ICD-10-CM | POA: Diagnosis not present

## 2020-06-26 LAB — SARS CORONAVIRUS 2 (TAT 6-24 HRS): SARS Coronavirus 2: NEGATIVE

## 2020-06-28 ENCOUNTER — Ambulatory Visit (INDEPENDENT_AMBULATORY_CARE_PROVIDER_SITE_OTHER): Payer: Medicare PPO | Admitting: Pulmonary Disease

## 2020-06-28 ENCOUNTER — Encounter: Payer: Self-pay | Admitting: Pulmonary Disease

## 2020-06-28 ENCOUNTER — Other Ambulatory Visit: Payer: Self-pay

## 2020-06-28 VITALS — BP 132/84 | HR 83 | Temp 97.3°F | Ht 63.0 in | Wt 196.0 lb

## 2020-06-28 DIAGNOSIS — J849 Interstitial pulmonary disease, unspecified: Secondary | ICD-10-CM | POA: Diagnosis not present

## 2020-06-28 DIAGNOSIS — R053 Chronic cough: Secondary | ICD-10-CM | POA: Diagnosis not present

## 2020-06-28 DIAGNOSIS — R0602 Shortness of breath: Secondary | ICD-10-CM | POA: Diagnosis not present

## 2020-06-28 LAB — PULMONARY FUNCTION TEST
DL/VA % pred: 113 %
DL/VA: 4.63 ml/min/mmHg/L
DLCO cor % pred: 93 %
DLCO cor: 16.84 ml/min/mmHg
DLCO unc % pred: 93 %
DLCO unc: 16.84 ml/min/mmHg
FEF 25-75 Post: 1.28 L/sec
FEF 25-75 Pre: 2.07 L/sec
FEF2575-%Change-Post: -38 %
FEF2575-%Pred-Post: 106 %
FEF2575-%Pred-Pre: 172 %
FEV1-%Change-Post: -13 %
FEV1-%Pred-Post: 90 %
FEV1-%Pred-Pre: 104 %
FEV1-Post: 1.57 L
FEV1-Pre: 1.83 L
FEV1FVC-%Change-Post: -14 %
FEV1FVC-%Pred-Pre: 117 %
FEV6-%Change-Post: 1 %
FEV6-%Pred-Post: 97 %
FEV6-%Pred-Pre: 96 %
FEV6-Post: 2.15 L
FEV6-Pre: 2.13 L
FEV6FVC-%Pred-Post: 106 %
FEV6FVC-%Pred-Pre: 106 %
FVC-%Change-Post: 1 %
FVC-%Pred-Post: 91 %
FVC-%Pred-Pre: 90 %
FVC-Post: 2.15 L
FVC-Pre: 2.13 L
Post FEV1/FVC ratio: 73 %
Post FEV6/FVC ratio: 100 %
Pre FEV1/FVC ratio: 86 %
Pre FEV6/FVC Ratio: 100 %

## 2020-06-28 NOTE — Progress Notes (Signed)
Full PFT performed today. Nitrogen washout performed because patient had panic attack during pleth.

## 2020-06-28 NOTE — Patient Instructions (Signed)
Shortness of breath secondary to ILD, deconditioning ?PAH - not hypoxemic --Defer autoimmune work-up --Prior radiation exposure --If symptoms worsen, consider RHC for eligibility of PAH meds if diagnostic --Encourage regular aerobic exercise at least three times a week --Will discuss Pulmonary Rehab at next visit if no improvement  Chronic cough secondary ILD --DISCONTINUE Breo   Follow-up with me in 6 months

## 2020-06-28 NOTE — Patient Instructions (Signed)
Full PFT performed today. °

## 2020-06-28 NOTE — Progress Notes (Signed)
Subjective:   PATIENT ID: Sydney Wood GENDER: female DOB: Apr 26, 1935, MRN: [id number]   HPI  No chief complaint on file.   Reason for Visit: Follow-up PFTs  [name] is an 85year old female with OSA on CPAP, history of right breast cancer s/p lumpectomy and radiation in remission, hypertension, hyperlipidemia, hypothyroidism, reflux who presents as a new consult.  She was referred by her PCP Dr. Joylene Wood on 01/30/2020 during a telephone encounter regarding abnormal CT chest demonstrating extra mucus in the airways and scarring. She was referred to pulmonary.  She reports long standing history of shortness of breath with exertion with short-medium distances. She gets out of breath after walking a flight of stairs. She will need to rest after walking in the grocery store after 10 minutes. She is unsure of when her symptoms began but it has been gradually worsening and now associated with a cough. She describes having a non-productive cough. No wheezing. Has not measured oxygen levels at home.  She has a diagnosis of sleep apnea and is compliant with CPAP nightly. Feels this helps with her quality of sleep and energy.  Since our last visit, she was started [place]. Since starting she reports feeling bloated, blurred vision, hot flashes and tingling feeling in legs and ear pressure.  Does not feel like her breathing has changed but maybe she has been more active, able to shop more. She is able to move around the house and gardening. She previously was walking her neighborhood but no longer participating.   Social History: Former smoker. Quit in 1972. Smoked 1/2 in her 58s beginning in college. 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                                                                                                                                                       Past Medical History:  Diagnosis Date   Breast cancer ([place])    GERD (gastroesophageal reflux disease)    Hyperlipidemia    Hypothyroidism    Indigestion    S/P radiation therapy 06/14/14-07/12/14   right breast 50Gy total dose   Sleep apnea    CPAP   Syncope and collapse 2006   Wears glasses    Wears partial dentures     Allergies  Allergen Reactions   Latex Hives and Swelling   Shellfish-Derived Products Nausea And Vomiting   Simvastatin-High Dose Other (See Comments)    Muscle aches, flu like symptoms.      Outpatient Medications Prior to Visit  Medication Sig Dispense Refill   atorvastatin (LIPITOR) 10 MG tablet Take 10 mg by mouth daily.  1  cholecalciferol (VITAMIN D) 400 UNITS TABS Take 400 Units by mouth daily.     doxepin (SINEQUAN) 50 MG capsule Take 50 mg daily by mouth.     fluticasone furoate-vilanterol (BREO ELLIPTA) 200-25 MCG/INH AEPB Inhale 1 puff into the lungs daily. 60 each 6   ibuprofen (ADVIL,MOTRIN) 100 MG tablet Take 100 mg as needed by mouth for pain.      Levothyroxine Sodium (SYNTHROID PO) Take 150 mcg by mouth daily.      Magnesium 200 MG TABS Take 1,000 mg daily by mouth.      Multiple Vitamin (MULTIVITAMIN) tablet Take 1 tablet by mouth daily.     omeprazole (PRILOSEC) 20 MG capsule Take 20 mg by mouth daily.     rOPINIRole (REQUIP) 0.5 MG tablet Take 0.5 mg by mouth daily.  0   telmisartan (MICARDIS) 20 MG tablet Take 20 mg by mouth daily.      temazepam (RESTORIL) 15 MG capsule Take 15 mg by mouth at bedtime as needed for sleep.     hydrochlorothiazide (HYDRODIURIL) 25 MG tablet TAKE 1 TABLET(25 MG) BY MOUTH DAILY 90 tablet 2   No facility-administered medications prior to visit.    Review of Systems  Constitutional:  Negative for chills, diaphoresis, fever, malaise/fatigue and weight loss.  HENT:  Negative for congestion.    Respiratory:  Positive for shortness of breath. Negative for cough, hemoptysis, sputum production and wheezing.   Cardiovascular:  Negative for chest pain, palpitations and leg swelling.    Objective:   Vitals:   06/28/20 1444  BP: 132/84  Pulse: 83  Temp: (!) 97.3 F (36.3 C)  TempSrc: Temporal  SpO2: 98%  Weight: 196 lb (88.9 kg)  Height: 5\' 3"  (1.6 m)   SpO2: 98 % (on RA)  Physical Exam: General: Well-appearing, no acute distress HENT: [place], AT Eyes: EOMI, no scleral icterus Respiratory: Diminished breath sounds bilaterally.  No crackles, wheezing or rales Cardiovascular: RRR, -M/R/G, no JVD Extremities:-Edema,-tenderness Neuro: AAO x4, CNII-XII grossly intact Skin: Intact, no rashes or bruising Psych: Normal mood, normal affect  Data Reviewed:  Imaging: CT chest HR 02/21/2020-basilar and subpleural groundglass with minimal reticulation and single-layer honeycombing notable in the right middle lobe.  Mosaic attenuation.  Traction bronchiectasis present.  Enlarged PA suggestive of pulmonary arterial hypertension.  PFT: 06/28/20 FVC 2.15 (91%) FEV1 1.57 (90%) Ratio 86  TLC 72% DLCO 93% Interpretation: Mild restrictive defect. Normal DLCO   Labs: [facility] labs Basic metabolic panel reviewed.  12/07/2019 Sodium 129, glucose 131.  Remaining values within normal limits CBC reviewed 10/25/2019 WBC 6.35 Absolute eosinophils 100    Assessment & Plan:   Discussion:  85 year old female with OSA on CPAP, history of right breast cancer s/p lumpectomy and radiation in remission who presents for shortness of breath and new non-productive cough. CT imaging with evidence of ILD.Ambulatory O2 in-clinic demonstrates no need for oxygen. Will need to obtain pulmonary function tests to determine severity of her ILD. We discussed work-up including ruling out autoimmune process. She has prior exposure to radiation however bilateral involvement would not be consistent with  this.  We discussed clinical course and management of ILD including bronchodilators, deep breathing techniques and treatment of flares. She expresses understanding and would like to defer auto-immune work-up due to her age and focus on symptomatic management.  Reviewed PFTs in detail together. Will discontinue bronchodilators due to ineffectiveness. Encouraged regular activity  Shortness of breath secondary to ILD, deconditioning ?PAH - not hypoxemic --  Defer autoimmune work-up --Prior radiation exposure --If symptoms worsen, consider RHC for eligibility of PAH meds if diagnostic --Encourage regular aerobic exercise at least three times a week --Will discuss Pulmonary Rehab at next visit if no improvement  Chronic cough secondary ILD --DISCONTINUE Breo   OSA on CPAP --Continue CPAP at least 4 hours daily --Keep follow-up with Sleep/PCP provider. If you wish to switch, let us know.   Health Maintenance Immunization History  Administered Date(s) Administered   Influenza, Quadrivalent, Recombinant, Inj, Pf 09/17/2017, 10/28/2018, 11/26/2019   PFIZER(Purple Top)SARS-COV-2 Vaccination 02/06/2019, 02/26/2019, 11/01/2019   CT Lung Screen - not a candidate. Insufficient smoking history  No orders of the defined types were placed in this encounter.  No orders of the defined types were placed in this encounter.   Return in about 6 months (around 12/28/2020).  I have spent a total time of 31-minutes on the day of the appointment reviewing prior documentation, coordinating care and discussing medical diagnosis and plan with the patient/family. Imaging, labs and tests included in this note have been reviewed and interpreted independently by me.  [place], MD [facility] 06/28/2020 3:05 PM  Office Number [phone]

## 2020-07-04 ENCOUNTER — Encounter: Payer: Self-pay | Admitting: Hematology and Oncology

## 2020-07-06 ENCOUNTER — Encounter: Payer: Self-pay | Admitting: Pulmonary Disease

## 2020-07-06 DIAGNOSIS — Z9989 Dependence on other enabling machines and devices: Secondary | ICD-10-CM

## 2020-07-06 DIAGNOSIS — G4733 Obstructive sleep apnea (adult) (pediatric): Secondary | ICD-10-CM | POA: Insufficient documentation

## 2020-07-06 NOTE — Telephone Encounter (Signed)
I have been told that I need a sleep study in order to get a new CPAP machine.  Mine is very old and having issues.  Can you do the study for me and prescribe a new machine after the study?   Thank You, Sydney Wood. [place] [phone] [email or web address]  Dr. Loanne Drilling, please advise on mychart message. Thanks!

## 2020-07-06 NOTE — Telephone Encounter (Signed)
Please order home sleep study for patient.

## 2020-07-10 DIAGNOSIS — I10 Essential (primary) hypertension: Secondary | ICD-10-CM | POA: Diagnosis not present

## 2020-07-10 DIAGNOSIS — R059 Cough, unspecified: Secondary | ICD-10-CM | POA: Diagnosis not present

## 2020-07-10 DIAGNOSIS — J069 Acute upper respiratory infection, unspecified: Secondary | ICD-10-CM | POA: Diagnosis not present

## 2020-07-12 ENCOUNTER — Ambulatory Visit: Payer: Medicare PPO

## 2020-07-23 ENCOUNTER — Telehealth: Payer: Self-pay | Admitting: Pulmonary Disease

## 2020-07-23 DIAGNOSIS — Z1231 Encounter for screening mammogram for malignant neoplasm of breast: Secondary | ICD-10-CM | POA: Diagnosis not present

## 2020-07-24 ENCOUNTER — Ambulatory Visit: Payer: Medicare PPO | Admitting: Pulmonary Disease

## 2020-07-25 NOTE — Telephone Encounter (Signed)
Dr. Loanne Drilling, please see recent mychart messages sent by pt and advise: Sydney Wood, Sydney People, MD 15 hours ago (7:00 PM)   Dr. Loanne Drilling I now have a lot of pressure in my ears.  Could it be caused by the different CPAP that I am using?   Is there an OTC medication  that I could use that might help to reduce the pressure? Thank You. [name], MD 2 weeks ago   My CPAP stopped working on June 13.  [facility] on [address] could not repair it.  They have loaned me a used one to use free of charge.  I will be back in town after June 27.  I would like to do the home sleep study as soon as possible so you can do a Prescription for a new one at [facility].  Thank you so much. Sydney Wood

## 2020-08-02 NOTE — Telephone Encounter (Signed)
Received the following message from patient:   "Dr. Loanne Drilling, I have used the saline and flonase daily.  Did not use a decongestant because of BP and insomnia issues.  I still have pressure in my ears.  Do you have any other suggestions?   Thank you, Sydney Wood"   I started to suggest Coricidin but wanted to check with you first. Do you have any recommendations? Thanks!

## 2020-08-03 NOTE — Telephone Encounter (Signed)
This one has been authorized and Monico Blitz has it to set up [facility]

## 2020-08-06 NOTE — Telephone Encounter (Signed)
Attempted to reach pt. LVM to call back and sched HST.

## 2020-08-13 ENCOUNTER — Ambulatory Visit: Payer: Medicare PPO | Attending: Internal Medicine

## 2020-08-13 ENCOUNTER — Other Ambulatory Visit: Payer: Self-pay

## 2020-08-13 ENCOUNTER — Ambulatory Visit: Payer: Medicare PPO | Admitting: Cardiovascular Disease

## 2020-08-13 ENCOUNTER — Other Ambulatory Visit (HOSPITAL_BASED_OUTPATIENT_CLINIC_OR_DEPARTMENT_OTHER): Payer: Self-pay

## 2020-08-13 DIAGNOSIS — Z23 Encounter for immunization: Secondary | ICD-10-CM

## 2020-08-13 MED ORDER — PFIZER-BIONT COVID-19 VAC-TRIS 30 MCG/0.3ML IM SUSP
INTRAMUSCULAR | 0 refills | Status: DC
  Filled 2020-08-13: qty 0.3, 1d supply, fill #0

## 2020-08-13 NOTE — Progress Notes (Signed)
   Covid-19 Vaccination Clinic  Name:  Sydney Wood    MRN: [id number] DOB: [birth year]  08/13/2020  [name] was observed post Covid-19 immunization for 15 minutes without incident. She was provided with Vaccine Information Sheet and instruction to access the V-Safe system.   [name] was instructed to call 911 with any severe reactions post vaccine: Difficulty breathing  Swelling of face and throat  A fast heartbeat  A bad rash all over body  Dizziness and weakness   Immunizations Administered     Name Date Dose VIS Date Route   PFIZER Comrnaty(Gray TOP) Covid-19 Vaccine 08/13/2020  1:33 PM 0.3 mL 01/05/2020 Intramuscular   Manufacturer: [place]   Lot: [id number]   [place]: [phone]

## 2020-08-15 DIAGNOSIS — H6506 Acute serous otitis media, recurrent, bilateral: Secondary | ICD-10-CM | POA: Diagnosis not present

## 2020-08-21 ENCOUNTER — Ambulatory Visit: Payer: Medicare PPO

## 2020-08-21 ENCOUNTER — Other Ambulatory Visit: Payer: Self-pay

## 2020-08-21 DIAGNOSIS — G4733 Obstructive sleep apnea (adult) (pediatric): Secondary | ICD-10-CM

## 2020-08-27 NOTE — Telephone Encounter (Signed)
Dr. Loanne Drilling, please advise on mychart message:    Dr. Loanne Drilling   I did the sleep study on July 26.  Hope it was a conclusive test.  My Insomnia was very evident during the test.  I would like to have the prescription for a new CPAP sent to [facility] on [address].   My ear pressure has cleared up.  Thank you so much for your help.   Sydney Wood

## 2020-08-28 DIAGNOSIS — G4733 Obstructive sleep apnea (adult) (pediatric): Secondary | ICD-10-CM | POA: Diagnosis not present

## 2020-09-03 ENCOUNTER — Telehealth: Payer: Self-pay | Admitting: Pulmonary Disease

## 2020-09-03 DIAGNOSIS — G4733 Obstructive sleep apnea (adult) (pediatric): Secondary | ICD-10-CM

## 2020-09-03 NOTE — Telephone Encounter (Signed)
Reviewed home sleep study completed on 08/21/20.  AHI 8.7 SpO2 nadir 82%.   Assessment Mild obstructive sleep apnea  Will need auto CPAP 5-15 cm H20 and supplies for new start. Please update patient on above results.  Rodman Pickle, M.D. [facility] 09/03/2020 8:30 AM

## 2020-09-03 NOTE — Telephone Encounter (Signed)
Call returned to patient, confirmed DOB. Made aware of HST results. Voiced understanding. Patient using [facility] for cpap supplies and machine. New order placed. Patient aware to call [facility] in next weeks to f/u. Aware to call us if she runs into any road blocks.   Nothing further needed at this time.

## 2020-10-08 ENCOUNTER — Ambulatory Visit: Payer: Medicare PPO | Admitting: Pulmonary Disease

## 2020-10-08 ENCOUNTER — Other Ambulatory Visit: Payer: Self-pay

## 2020-10-08 ENCOUNTER — Encounter: Payer: Self-pay | Admitting: Pulmonary Disease

## 2020-10-08 ENCOUNTER — Ambulatory Visit (INDEPENDENT_AMBULATORY_CARE_PROVIDER_SITE_OTHER): Payer: Medicare PPO

## 2020-10-08 VITALS — BP 152/78 | HR 70 | Temp 98.0°F | Ht 63.0 in | Wt 197.6 lb

## 2020-10-08 DIAGNOSIS — J849 Interstitial pulmonary disease, unspecified: Secondary | ICD-10-CM

## 2020-10-08 DIAGNOSIS — R0602 Shortness of breath: Secondary | ICD-10-CM

## 2020-10-08 DIAGNOSIS — I77810 Thoracic aortic ectasia: Secondary | ICD-10-CM | POA: Diagnosis not present

## 2020-10-08 DIAGNOSIS — R918 Other nonspecific abnormal finding of lung field: Secondary | ICD-10-CM | POA: Diagnosis not present

## 2020-10-08 NOTE — Progress Notes (Signed)
Subjective:   PATIENT ID: Sydney Wood GENDER: female DOB: [birth year], MRN: [id number]   HPI  Chief Complaint  Patient presents with   Follow-up    SOB with activity     Reason for Visit: Follow-up   [name] is an 85 year old female with OSA on CPAP, history of right breast cancer s/p lumpectomy and radiation in remission, hypertension, hyperlipidemia, hypothyroidism, reflux who presents as a new consult.  She was referred by her PCP Dr. Joylene Wood on 01/30/2020 during a telephone encounter regarding abnormal CT chest demonstrating extra mucus in the airways and scarring. She was referred to pulmonary.  She reports long standing history of shortness of breath with exertion with short-medium distances. She gets out of breath after walking a flight of stairs. She will need to rest after walking in the grocery store after 10 minutes. She is unsure of when her symptoms began but it has been gradually worsening and now associated with a cough. She describes having a non-productive cough. No wheezing. Has not measured oxygen levels at home.  She has a diagnosis of sleep apnea and is compliant with CPAP nightly. Feels this helps with her quality of sleep and energy.  Since our last visit, she was started [place]. Since starting she reports feeling bloated, blurred vision, hot flashes and tingling feeling in legs and ear pressure.  Does not feel like her breathing has changed but maybe she has been more active, able to shop more. She is able to move around the house and gardening. She previously was walking her neighborhood but no longer participating.   10/08/20 Her husband is currently in the hospital for cardiac cath. She recently had worsening productive cough and had CXR completed in June and was treated with prednisone. She was advised to see Pulmonary sooner. No residual symptoms from infection  Social History: Former smoker. Quit in 1972. Smoked 1/2 in her 38s beginning in  college. 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 Had to evacuate one room due to asbestos  Environmental exposures:  Radiation                                                                                                                                                      Past Medical History:  Diagnosis Date   Breast cancer ([place])    GERD (gastroesophageal reflux disease)    Hyperlipidemia    Hypothyroidism    Indigestion    S/P radiation therapy 06/14/14-07/12/14   right breast 50Gy total dose   Sleep apnea    CPAP   Syncope and collapse 2006   Wears glasses    Wears partial dentures     Allergies  Allergen Reactions   Latex Hives and Swelling   Shellfish-Derived Products Nausea And Vomiting  Simvastatin-High Dose Other (See Comments)    Muscle aches, flu like symptoms.      Outpatient Medications Prior to Visit  Medication Sig Dispense Refill   atorvastatin (LIPITOR) 10 MG tablet Take 10 mg by mouth daily.  1   cholecalciferol (VITAMIN D) 400 UNITS TABS Take 400 Units by mouth daily.     COVID-19 mRNA Vac-TriS, Pfizer, (PFIZER-BIONT COVID-19 VAC-TRIS) SUSP injection Inject into the muscle. 0.3 mL 0   doxepin (SINEQUAN) 50 MG capsule Take 50 mg daily by mouth.     ibuprofen (ADVIL,MOTRIN) 100 MG tablet Take 100 mg as needed by mouth for pain.      Levothyroxine Sodium (SYNTHROID PO) Take 150 mcg by mouth daily.      Magnesium 200 MG TABS Take 1,000 mg daily by mouth.      Multiple Vitamin (MULTIVITAMIN) tablet Take 1 tablet by mouth daily.     omeprazole (PRILOSEC) 20 MG capsule Take 20 mg by mouth daily.     rOPINIRole (REQUIP) 0.5 MG tablet Take 0.5 mg by mouth daily.  0   telmisartan (MICARDIS) 20 MG tablet Take 20 mg by mouth daily.      temazepam (RESTORIL) 15 MG capsule Take 15 mg by mouth at bedtime as needed for sleep.     No facility-administered medications prior to visit.    Review of Systems  Constitutional:  Negative for  chills, diaphoresis, fever, malaise/fatigue and weight loss.  HENT:  Negative for congestion.   Respiratory:  Positive for cough, sputum production and shortness of breath. Negative for hemoptysis and wheezing.   Cardiovascular:  Negative for chest pain, palpitations and leg swelling.    Objective:   Vitals:   10/08/20 1411  BP: (!) 152/78  Pulse: 70  Temp: 98 F (36.7 C)  TempSrc: Oral  SpO2: 98%  Weight: 197 lb 9.6 oz (89.6 kg)  Height: '5\' 3"'$  (1.6 m)   SpO2: 98 % O2 Device: None (Room air)  Physical Exam: General: Well-appearing, no acute distress HENT: [place], AT Eyes: EOMI, no scleral icterus Respiratory: Clear to auscultation bilaterally.  No crackles, wheezing or rales Cardiovascular: RRR, -M/R/G, no JVD Extremities:-Edema,-tenderness Neuro: AAO x4, CNII-XII grossly intact Psych: Normal mood, normal affect  Data Reviewed:  Imaging: CT chest HR 02/21/2020-basilar and subpleural groundglass with minimal reticulation and single-layer honeycombing notable in the right middle lobe.  Mosaic attenuation.  Traction bronchiectasis present.  Enlarged PA suggestive of pulmonary arterial hypertension.  PFT: 06/28/20 FVC 2.15 (91%) FEV1 1.57 (90%) Ratio 86  TLC 72% DLCO 93% Interpretation: Mild restrictive defect. Normal DLCO  Sleep test 09/03/20 AHI 8.7 SpO2 nadir 82% Recommend auto CPAP 5-15 cm H20  Labs: [employer] labs Basic metabolic panel reviewed.  12/07/2019 Sodium 129, glucose 131.  Remaining values within normal limits CBC reviewed 10/25/2019 WBC 6.35 Absolute eosinophils 100    Assessment & Plan:   Discussion: 85 year old female with OSA on CPAP, hx of right breast cancer s/p lumpectomy and radiation in remission who presents for follow-up. She has prior exposure to radiation however bilateral involvement would not be consistent with this.  We discussed clinical course and management of ILD. Not on bronchodilators due to ineffectiveness.   Shortness  of breath secondary to ILD, deconditioning ?Pulmonary arterial hypertension- not hypoxemic --Defer autoimmune work-up --Prior radiation exposure --CXR with persistent infiltrate. Recommend CT Chest without contrast --If symptoms worsen, consider right heart cath for eligibility of PAH meds if diagnostic, though likely secondary to OSA --Encourage  regular aerobic exercise at least three times a week --Will discuss Pulmonary Rehab at next visit if no improvement  Chronic cough secondary ILD --No benefit from Breo  Mild OSA on CPAP --Awaiting new CPAP supplies. Currently has old machine --Counseled NOT to drive if/when sleepy --Advised patient to wear CPAP for at least 4 hours each night for greater than 70% of the time to avoid the machine being repossessed by insurance.    Health Maintenance Immunization History  Administered Date(s) Administered   Influenza, Quadrivalent, Recombinant, Inj, Pf 09/17/2017, 10/28/2018, 11/26/2019   PFIZER Comirnaty(Gray Top)Covid-19 Tri-Sucrose Vaccine 08/13/2020   PFIZER(Purple Top)SARS-COV-2 Vaccination 02/06/2019, 02/26/2019, 11/01/2019   Zoster Recombinat (Shingrix) 06/29/2017, 08/28/2017   CT Lung Screen - not a candidate. Insufficient smoking history  Orders Placed This Encounter  Procedures   DG Chest 2 View    Standing Status:   Future    Number of Occurrences:   1    Standing Expiration Date:   10/08/2021    Order Specific Question:   Reason for Exam (SYMPTOM  OR DIAGNOSIS REQUIRED)    Answer:   ILD    Order Specific Question:   Preferred imaging location?    Answer:   Internal   CT Chest Wo Contrast    [place]/TR [employer]    Standing Status:   Future    Standing Expiration Date:   10/08/2021    Scheduling Instructions:     Please schedule first available    Order Specific Question:   Preferred imaging location?    Answer:   [place]    No orders of the defined types were placed in this encounter.   Return  in about 1 month (around 11/07/2020).  I have spent a total time of 35-minutes on the day of the appointment reviewing prior documentation, coordinating care and discussing medical diagnosis and plan with the patient/family. Past medical history, allergies, medications were reviewed. Pertinent imaging, labs and tests included in this note have been reviewed and interpreted independently by me.  Jennings Lodge, MD [facility] 10/08/2020 2:15 PM  Office Number [phone]

## 2020-10-08 NOTE — Patient Instructions (Addendum)
Shortness of breath secondary to ILD, deconditioning ?Pulmonary arterial hypertension- not hypoxemic --Defer autoimmune work-up --Prior radiation exposure --CXR with persistent infiltrate. Recommend CT Chest without contrast --If symptoms worsen, consider right heart cath for eligibility of PAH meds if diagnostic, though likely secondary to OSA --Encourage regular aerobic exercise at least three times a week --Will discuss Pulmonary Rehab at next visit if no improvement   Chronic cough secondary ILD --No benefit from Breo   Mild OSA on CPAP --Awaiting new CPAP supplies. Currently has old machine --Counseled NOT to drive if/when sleepy --Advised patient to wear CPAP for at least 4 hours each night for greater than 70% of the time to avoid the machine being repossessed by insurance.'  Follow-up with me in one month

## 2020-10-11 DIAGNOSIS — J849 Interstitial pulmonary disease, unspecified: Secondary | ICD-10-CM

## 2020-10-11 DIAGNOSIS — R0602 Shortness of breath: Secondary | ICD-10-CM

## 2020-10-11 DIAGNOSIS — G4733 Obstructive sleep apnea (adult) (pediatric): Secondary | ICD-10-CM

## 2020-10-11 NOTE — Telephone Encounter (Signed)
Received the following email from patient:   "Dr. Loanne Drilling, My husband was diagnosed with Covid this morning at his [place] office.   Should I cancel my appointment to get the Scan next Thursday afternoon?  I have had four shots and have never had Covid.   Thank You, Chrystie Nose. [place] [phone]"  I advised her that I would check with the PCCs in regards to the [place] with [place] CT. She is scheduled for a CT on 10/18/20. Would she need to reschedule her CT since she has been in contact with a positive COVID patient?

## 2020-10-12 NOTE — Telephone Encounter (Signed)
There policy would be the same as ours since they are [facility]

## 2020-10-14 NOTE — Telephone Encounter (Signed)
FYI For Dr. Loanne Drilling and [facility] Nurse Estill Bamberg,  I tested positive for Covid this morning.  Will take Paxlovid for five days.  I have decided to cancel the scan on September 22.  Will call them and get another appointment.   Thank You,  Sydney Wood

## 2020-10-15 NOTE — Telephone Encounter (Signed)
Staff please ensure two month follow-up

## 2020-10-17 ENCOUNTER — Inpatient Hospital Stay: Admission: RE | Admit: 2020-10-17 | Payer: Medicare PPO | Source: Ambulatory Visit

## 2020-10-18 ENCOUNTER — Inpatient Hospital Stay: Admission: RE | Admit: 2020-10-18 | Payer: Medicare PPO | Source: Ambulatory Visit

## 2020-10-26 ENCOUNTER — Ambulatory Visit (INDEPENDENT_AMBULATORY_CARE_PROVIDER_SITE_OTHER)
Admission: RE | Admit: 2020-10-26 | Discharge: 2020-10-26 | Disposition: A | Discharge status: Home / Self Care | Payer: Medicare PPO | Source: Ambulatory Visit | Attending: Pulmonary Disease | Admitting: Pulmonary Disease

## 2020-10-26 ENCOUNTER — Other Ambulatory Visit: Payer: Self-pay

## 2020-10-26 DIAGNOSIS — J849 Interstitial pulmonary disease, unspecified: Secondary | ICD-10-CM

## 2020-10-26 DIAGNOSIS — J479 Bronchiectasis, uncomplicated: Secondary | ICD-10-CM | POA: Diagnosis not present

## 2020-10-26 DIAGNOSIS — I700 Atherosclerosis of aorta: Secondary | ICD-10-CM | POA: Diagnosis not present

## 2020-10-28 ENCOUNTER — Encounter: Payer: Self-pay | Admitting: Cardiovascular Disease

## 2020-10-28 NOTE — Progress Notes (Signed)
Sydney Wood Date of Birth  02-10-1935 [facility] [address]    [place], [place]  [address]    Problem List 1. Hypertension 2. Hyperlipidemia 3. Sleep apnea 4. Breast Cancer    85 yo with a hx of presyncope, hyperlipidemia, sleep apnea.  She has been under a lot of stress recently. Her sister was diagnosed with stage III inflammatory breast cancer. She has been going to [facility] a regular basis to help her sister with chemotherapy and other related issues.  She denies any chest pain, shortness breath, syncope, or presyncope.  She has a history of hypercholesterolemia. Her cholesterol levels are followed by Sydney Wood.  She brought her blood pressure log with her today. All of her readings are acceptable.  She brought labs were drawn at Dr. Lindell Wood his office. All of her numbers are quite good. Her a protein B. level is 67 which is quite good. Her total cholesterol is 150. The triglyceride level is 129. The HDL is 42. Her LDL is 82.  She's not had any episodes of chest pain or shortness breath.  Nov. 17, 2014: Feeling well.  BP at home is usually normal.  Still traveling lots - helping her sister with breast cancer.  Exercising a bit. tries to walk in the neighborhood.   Nov. 24, 2015:  Sydney Wood is doing well.  Tries to exercise.  BP readings are ok No CP or dyspnea.   Nov. 28, 2016:    Has been diagnosed with right breast breast cancer ( DCIS, stage 0)  Had lumpectomy and 20 XRT treatments. Has had left shoulder problems  Was also found to have Meralgia Paresthetica  - affects her sciatic nerve and left leg pain and tingling .  Is now getting back some energy   April 22, 2016:  Doing well.   BP has been a little elevated   Feeling well  Labs from [facility] look good.  Her total cholesterol is 133.  The triglyceride level is 94.  HDL level is 41.  The LDL is 73.  Nov. 15, 2018:    Doing ok We started [place] last visit.    Thinks her vision is not as good ,  More blurred vision Walks on occasion  July 21, 2017: Doing well . Taking her BP at home  -  BP is well controlled at home  No CP or dyspnea.  Lipids managed by Sydney Wood  Is on [facility] - tolerating it well   Oct. 1, 2020   Sydney Wood is seen today for follow-up of her hypertension and hyperlipidemia.  Recently saw Sydney Wood,  BP was up so he increased the Telmasartan  Feeling well,  Not exercising much Her TSH was low so her synthroid dose was reduced.  ( which may explain her HR being a bit high)   Oct. 4, 2021: Sydney Wood is seen today for follow up of her HTN and HLD  BP is normal at home  No CP or dyspnea Has some DOE,  Dose not exercise .   She tries to walk on occasion  Sees Sydney Wood for her lipid management.   Oct. 3, 2022 Sydney Wood is seen today for follow up of her HTN and HLD  Has developed Idopathic pulmonary fibrosis Possibly caused by radiation from her breast radiation Is seeing pulmonary  .  Dr. Loanne Wood may want a right heart cath if her symptoms worsen.  Labs from Dr. Tempie Wood office from September,  2021 show a total cholesterol of 140, triglyceride level is 126, HDL is 43, LDL is 72. Sodium level is 129.  Her HCTZ was discontinued following this lab result.  Potassium is normal.  Liver functions are normal.  Creatinine 0.09 TSH was 0.16 which is normal    Current Outpatient Medications on File Prior to Visit  Medication Sig Dispense Refill   atorvastatin (LIPITOR) 10 MG tablet Take 10 mg by mouth daily.  1   cholecalciferol (VITAMIN D) 400 UNITS TABS Take 400 Units by mouth daily.     COVID-19 mRNA Vac-TriS, Pfizer, (PFIZER-BIONT COVID-19 VAC-TRIS) SUSP injection Inject into the muscle. 0.3 mL 0   doxepin (SINEQUAN) 50 MG capsule Take 50 mg daily by mouth.     ibuprofen (ADVIL,MOTRIN) 100 MG tablet Take 100 mg as needed by mouth for pain.      Levothyroxine Sodium (SYNTHROID PO) Take 150 mcg by mouth daily.       Magnesium 200 MG TABS Take 1,000 mg daily by mouth.      Multiple Vitamin (MULTIVITAMIN) tablet Take 1 tablet by mouth daily.     omeprazole (PRILOSEC) 20 MG capsule Take 1 capsule by mouth daily.     rOPINIRole (REQUIP) 0.5 MG tablet Take 0.5 mg by mouth daily.  0   telmisartan (MICARDIS) 20 MG tablet Take 20 mg by mouth daily.      temazepam (RESTORIL) 15 MG capsule Take 15 mg by mouth at bedtime as needed for sleep.     No current facility-administered medications on file prior to visit.    Allergies  Allergen Reactions   Latex Hives and Swelling   Shellfish-Derived Products Nausea And Vomiting   Simvastatin-High Dose Other (See Comments)    Muscle aches, flu like symptoms.     Past Medical History:  Diagnosis Date   Breast cancer ([place])    GERD (gastroesophageal reflux disease)    Hyperlipidemia    Hypothyroidism    Indigestion    S/P radiation therapy 06/14/14-07/12/14   right breast 50Gy total dose   Sleep apnea    CPAP   Syncope and collapse 2006   Wears glasses    Wears partial dentures     Past Surgical History:  Procedure Laterality Date   BREAST LUMPECTOMY WITH RADIOACTIVE SEED LOCALIZATION Right 05/18/2014   Procedure: BREAST LUMPECTOMY WITH RADIOACTIVE SEED LOCALIZATION;  Surgeon: Rolm Bookbinder, MD;  Location: [place];  Service: General;  Laterality: Right;   CARDIOVASCULAR STRESS TEST  12/17/2004   EF 73%. NO EVIDENCE OF ISCHEMIA   CARPAL TUNNEL RELEASE     LEFT HAND   COLONOSCOPY     DILATION AND CURETTAGE OF UTERUS     x2   TUBAL LIGATION     US ECHOCARDIOGRAPHY  12/12/2004   EF 55-60%    Social History   Tobacco Use  Smoking Status Former   Packs/day: 0.50   Years: 15.00   Pack years: 7.50   Types: Cigarettes   Quit date: 01/27/1970   Years since quitting: 50.7  Smokeless Tobacco Never    Social History   Substance and Sexual Activity  Alcohol Use No    Family History  Problem Relation Age of Onset   Coronary  artery disease Father    Heart attack Father    Breast cancer Sister    Lymphoma Brother    Breast cancer Paternal Grandmother    Colon cancer Neg Hx    Stomach cancer Neg Hx  Reviw of Systems:  Noted in current history, otherwise review of systems is negative.   Physical Exam: Blood pressure 140/82, pulse 93, height 5\' 3"  (1.6 m), weight 196 lb 6.4 oz (89.1 kg), SpO2 95 %.  GEN:  Well nourished, well developed in no acute distress HEENT: Normal NECK: No JVD; No carotid bruits LYMPHATICS: No lymphadenopathy CARDIAC: RRR , no murmurs, rubs, gallops RESPIRATORY:  Clear to auscultation without rales, wheezing or rhonchi  ABDOMEN: Soft, non-tender, non-distended MUSCULOSKELETAL:  No edema; No deformity  SKIN: Warm and dry NEUROLOGIC:  Alert and oriented x 3     ECG:     October 29, 2020: Normal sinus rhythm at 93.  No ST or T wave changes.  Assessment / Plan:   1. Hypertension -    blood pressure looks good overall.  Her systolic is still slightly elevated.  I encouraged her to work on regular diet, exercise, weight loss  Program.  I think that would help.  Continue with her same medications for now.   2. Hyperlipidemia-managed by Dr. Haynes Kerns.  3.  Interstitial lung disease: She has been diagnosed with some interstitial lung disease.  Most of this seems to be related to radiation for her right breast cancer.  The pulmonology team may eventually want her to have a right heart cath if she is found to have pulmonary hypertension.  This may help guide our therapy.  We will get an echocardiogram for further evaluation of her LV function and RV function and may be able to get some idea of her pulmonary pressures.          Mertie Moores, MD  10/29/2020 2:33 PM    [address] [place],  [place], [place]  [address] Pager [phone] Phone: [phone]; Fax: [phone]

## 2020-10-29 ENCOUNTER — Encounter: Payer: Self-pay | Admitting: Cardiovascular Disease

## 2020-10-29 ENCOUNTER — Other Ambulatory Visit: Payer: Self-pay

## 2020-10-29 ENCOUNTER — Ambulatory Visit: Payer: Medicare PPO | Admitting: Cardiovascular Disease

## 2020-10-29 VITALS — BP 140/82 | HR 93 | Ht 63.0 in | Wt 196.4 lb

## 2020-10-29 DIAGNOSIS — R0602 Shortness of breath: Secondary | ICD-10-CM | POA: Diagnosis not present

## 2020-10-29 DIAGNOSIS — I10 Essential (primary) hypertension: Secondary | ICD-10-CM

## 2020-10-29 NOTE — Patient Instructions (Signed)
Medication Instructions:  Your physician recommends that you continue on your current medications as directed. Please refer to the Current Medication list given to you today.  *If you need a refill on your cardiac medications before your next appointment, please call your pharmacy*   Lab Work: NONE If you have labs (blood work) drawn today and your tests are completely normal, you will receive your results only by: [place] (if you have MyChart) OR A paper copy in the mail If you have any lab test that is abnormal or we need to change your treatment, we will call you to review the results.   Testing/Procedures: Your physician has requested that you have an echocardiogram. Echocardiography is a painless test that uses sound waves to create images of your heart. It provides your doctor with information about the size and shape of your heart and how well your heart's chambers and valves are working. This procedure takes approximately one hour. There are no restrictions for this procedure.    Follow-Up: At [facility], you and your health needs are our priority.  As part of our continuing mission to provide you with exceptional heart care, we have created designated Provider Care Teams.  These Care Teams include your primary Cardiologist (physician) and Advanced Practice Providers (APPs -  Physician Assistants and Nurse Practitioners) who all work together to provide you with the care you need, when you need it.    Your next appointment:   6 month(s)  The format for your next appointment:   In Person  Provider:   You may see Mertie Moores, MD or one of the following Advanced Practice Providers on your designated Care Team:   Richardson Dopp, PA-C [place], [place]

## 2020-11-08 NOTE — Telephone Encounter (Signed)
Pt is scheduled with Dr. Loanne Drilling on Tuesday December 11, 2020 at 2pm. Nothing further needed at this time.

## 2020-11-12 ENCOUNTER — Ambulatory Visit (HOSPITAL_COMMUNITY): Payer: Medicare PPO | Attending: Internal Medicine

## 2020-11-12 ENCOUNTER — Other Ambulatory Visit: Payer: Self-pay

## 2020-11-12 DIAGNOSIS — I10 Essential (primary) hypertension: Secondary | ICD-10-CM | POA: Insufficient documentation

## 2020-11-12 DIAGNOSIS — R0602 Shortness of breath: Secondary | ICD-10-CM | POA: Diagnosis not present

## 2020-11-12 LAB — ECHOCARDIOGRAM COMPLETE
Area-P 1/2: 3.25 cm2
P 1/2 time: 714 msec
S' Lateral: 2.1 cm

## 2020-11-16 DIAGNOSIS — M859 Disorder of bone density and structure, unspecified: Secondary | ICD-10-CM | POA: Diagnosis not present

## 2020-11-16 DIAGNOSIS — E039 Hypothyroidism, unspecified: Secondary | ICD-10-CM | POA: Diagnosis not present

## 2020-11-16 DIAGNOSIS — R7301 Impaired fasting glucose: Secondary | ICD-10-CM | POA: Diagnosis not present

## 2020-11-16 DIAGNOSIS — E785 Hyperlipidemia, unspecified: Secondary | ICD-10-CM | POA: Diagnosis not present

## 2020-11-23 DIAGNOSIS — E785 Hyperlipidemia, unspecified: Secondary | ICD-10-CM | POA: Diagnosis not present

## 2020-11-23 DIAGNOSIS — R82998 Other abnormal findings in urine: Secondary | ICD-10-CM | POA: Diagnosis not present

## 2020-11-23 DIAGNOSIS — Z23 Encounter for immunization: Secondary | ICD-10-CM | POA: Diagnosis not present

## 2020-11-23 DIAGNOSIS — I10 Essential (primary) hypertension: Secondary | ICD-10-CM | POA: Diagnosis not present

## 2020-11-23 DIAGNOSIS — G4700 Insomnia, unspecified: Secondary | ICD-10-CM | POA: Diagnosis not present

## 2020-11-23 DIAGNOSIS — Z853 Personal history of malignant neoplasm of breast: Secondary | ICD-10-CM | POA: Diagnosis not present

## 2020-11-23 DIAGNOSIS — E039 Hypothyroidism, unspecified: Secondary | ICD-10-CM | POA: Diagnosis not present

## 2020-11-23 DIAGNOSIS — Z0000 Encounter for general adult medical examination without abnormal findings: Secondary | ICD-10-CM | POA: Diagnosis not present

## 2020-11-23 DIAGNOSIS — G4733 Obstructive sleep apnea (adult) (pediatric): Secondary | ICD-10-CM | POA: Diagnosis not present

## 2020-11-23 DIAGNOSIS — M8580 Other specified disorders of bone density and structure, unspecified site: Secondary | ICD-10-CM | POA: Diagnosis not present

## 2020-11-23 DIAGNOSIS — Z1331 Encounter for screening for depression: Secondary | ICD-10-CM | POA: Diagnosis not present

## 2020-11-23 DIAGNOSIS — J849 Interstitial pulmonary disease, unspecified: Secondary | ICD-10-CM | POA: Diagnosis not present

## 2020-12-11 ENCOUNTER — Ambulatory Visit: Payer: Medicare PPO | Admitting: Pulmonary Disease

## 2020-12-11 ENCOUNTER — Encounter: Payer: Self-pay | Admitting: Pulmonary Disease

## 2020-12-11 ENCOUNTER — Other Ambulatory Visit: Payer: Self-pay

## 2020-12-11 VITALS — BP 152/78 | HR 86 | Temp 97.7°F | Ht 63.0 in | Wt 195.0 lb

## 2020-12-11 DIAGNOSIS — J849 Interstitial pulmonary disease, unspecified: Secondary | ICD-10-CM | POA: Diagnosis not present

## 2020-12-11 DIAGNOSIS — G4733 Obstructive sleep apnea (adult) (pediatric): Secondary | ICD-10-CM | POA: Diagnosis not present

## 2020-12-11 DIAGNOSIS — Z9989 Dependence on other enabling machines and devices: Secondary | ICD-10-CM | POA: Diagnosis not present

## 2020-12-11 NOTE — Progress Notes (Signed)
Subjective:   PATIENT ID: Sydney Wood GENDER: female DOB: 1935/06/16, MRN: [id number]   HPI  Chief Complaint  Patient presents with   Follow-up    Lung disease      Reason for Visit: Follow-up   Sydney Wood is an 85 year old female with OSA on CPAP, history of right breast cancer s/p lumpectomy and radiation in remission, hypertension, hyperlipidemia, hypothyroidism, reflux who presents for follow-up.  Synopsis: Initially referred by PCP Dr. Joylene Draft on 01/30/2020 for abnormal CT chest demonstrating extra mucus in the airways and scarring. She reports long standing history of shortness of breath with exertion with short-medium distances. She gets out of breath after walking a flight of stairs. She will need to rest after walking in the grocery store after 10 minutes. She is unsure of when her symptoms began but it has been gradually worsening and now associated with a cough. She describes having a non-productive cough. No wheezing. Has not measured oxygen levels at home.  2022 - Diagnosed with ILD with associated restrictive defect on PFTs. Treated for flare in June with prednisone. CT with slight progression of GGO.  12/26/20 Overall, she feels stable. No limitations in activity. Denies shortness of breath, cough or wheezing. She had an echocardiogram for murmur. She is compliant with her CPAP nightly 6-8 hours which she reports benefit in sleep quality.   Social History: Former smoker. Quit in 1972. Smoked 1/2 in her 66s beginning in college. 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 Had to evacuate one room due to asbestos Husband hospitalized in 09/2020 for cardiac cath  Environmental exposures:  Radiation                                                                                                                                                      Past Medical History:  Diagnosis Date   Breast cancer ([facility])    GERD (gastroesophageal  reflux disease)    Hyperlipidemia    Hypothyroidism    Indigestion    S/P radiation therapy 06/14/14-07/12/14   right breast 50Gy total dose   Sleep apnea    CPAP   Syncope and collapse 2006   Wears glasses    Wears partial dentures     Allergies  Allergen Reactions   Latex Hives and Swelling   Shellfish-Derived Products Nausea And Vomiting   Simvastatin-High Dose Other (See Comments)    Muscle aches, flu like symptoms.      Outpatient Medications Prior to Visit  Medication Sig Dispense Refill   atorvastatin (LIPITOR) 10 MG tablet Take 10 mg by mouth daily.  1   cholecalciferol (VITAMIN D) 400 UNITS TABS Take 400 Units by mouth daily.     COVID-19 mRNA Vac-TriS, Pfizer, (PFIZER-BIONT COVID-19 VAC-TRIS) SUSP injection Inject into the  muscle. 0.3 mL 0   doxepin (SINEQUAN) 50 MG capsule Take 50 mg daily by mouth.     ibuprofen (ADVIL,MOTRIN) 100 MG tablet Take 100 mg as needed by mouth for pain.      Levothyroxine Sodium (SYNTHROID PO) Take 150 mcg by mouth daily.      Magnesium 200 MG TABS Take 1,000 mg daily by mouth.      Multiple Vitamin (MULTIVITAMIN) tablet Take 1 tablet by mouth daily.     omeprazole (PRILOSEC) 20 MG capsule Take 1 capsule by mouth daily.     rOPINIRole (REQUIP) 0.5 MG tablet Take 0.5 mg by mouth daily.  0   telmisartan (MICARDIS) 20 MG tablet Take 20 mg by mouth daily.      temazepam (RESTORIL) 15 MG capsule Take 15 mg by mouth at bedtime as needed for sleep.     No facility-administered medications prior to visit.    Review of Systems  Constitutional:  Negative for chills, diaphoresis, fever, malaise/fatigue and weight loss.  HENT:  Negative for congestion.   Respiratory:  Negative for cough, hemoptysis, sputum production, shortness of breath and wheezing.   Cardiovascular:  Negative for chest pain, palpitations and leg swelling.    Objective:   Vitals:   12/11/20 1407  BP: (!) 152/78  Pulse: 86  Temp: 97.7 F (36.5 C)  TempSrc: Oral  SpO2:  91%  Weight: 195 lb (88.5 kg)  Height: _0  (1.6 m)   SpO2: 91 % O2 Device: None (Room air)  Physical Exam: General: Well-appearing, no acute distress HENT: [place], AT Eyes: EOMI, no scleral icterus Respiratory: Clear to auscultation bilaterally.  No crackles, wheezing or rales Cardiovascular: RRR, -M/R/G, no JVD Extremities:-Edema,-tenderness Neuro: AAO x4, CNII-XII grossly intact Psych: Normal mood, normal affect  Data Reviewed:  Imaging: CT chest HR 02/21/2020-basilar and subpleural groundglass with minimal reticulation and single-layer honeycombing notable in the right middle lobe.  Mosaic attenuation.  Traction bronchiectasis present.  Enlarged PA suggestive of pulmonary arterial hypertension. CT Chest 10/26/20 - Slight progression of GGO with minimal reticulation and septal thickening. Possible early honeycombing   PFT: 06/28/20 FVC 2.15 (91%) FEV1 1.57 (90%) Ratio 86  TLC 72% DLCO 93% Interpretation: Mild restrictive defect. Normal DLCO  Sleep test 08/21/20 HST - AHI 8.7 SpO2 nadir 83%. Recommend auto CPAP 5-15 cm H20  Labs: [employer] labs Basic metabolic panel reviewed.  12/07/2019 Sodium 129, glucose 131.  Remaining values within normal limits CBC reviewed 10/25/2019 WBC 6.35 Absolute eosinophils 100  Echocardiogram: 11/12/20 - EF 63%. Indeterminate mitral regurg, grade I DD    Assessment & Plan:   Discussion: 85 year old female with OSA on CPAP, hx of right breast cancer s/p lumpectomy and radiation in remission who presents for follow-up. She has prior exposure to radiation however bilateral involvement would not be consistent with this. We reviewed CT which demonstrated slight progression of GGO and early possible honeycombing in RML. We discussed clinical course and management of ILD. Not on bronchodilators due to ineffectiveness and side effects (failed Breo).  Mild OSA on CPAP --Still awaiting CPAP device. She is using a loaner --Counseled NOT to  drive if/when sleepy --Advised patient to wear CPAP for at least 4 hours each night for greater than 70% of the time to avoid the machine being repossessed by insurance.  ILD, unknown etiology --Prior radiation exposure --No evidence of pulmonary HTN on echocardiogram --Defer autoimmune work-up --Encourage regular aerobic exercise at least three times a week --Will need  follow-up CT in one year (09/2021). Order at next visit  Chronic cough secondary ILD - resolved --No benefit from [facility] and had myriad of side effects (bloated, blurred vision, hot flashes and tingling feeling in legs and ear pressure). Unclear if true effects related to inhaler  Health Maintenance Immunization History  Administered Date(s) Administered   Fluad Quad(high Dose 65+) 10/29/2020   Influenza, Quadrivalent, Recombinant, Inj, Pf 09/17/2017, 10/28/2018, 11/26/2019   PFIZER Comirnaty(Gray Top)Covid-19 Tri-Sucrose Vaccine 08/13/2020   PFIZER(Purple Top)SARS-COV-2 Vaccination 02/06/2019, 02/26/2019, 11/01/2019   Zoster Recombinat (Shingrix) 06/29/2017, 08/28/2017   CT Lung Screen - not a candidate. Insufficient smoking history  No orders of the defined types were placed in this encounter.  No orders of the defined types were placed in this encounter.   Return in about 6 months (around 06/10/2021).  I have spent a total time of 36-minutes on the day of the appointment reviewing prior documentation, coordinating care and discussing medical diagnosis and plan with the patient/family. Past medical history, allergies, medications were reviewed. Pertinent imaging, labs and tests included in this note have been reviewed and interpreted independently by me.  Gem, MD [facility] 12/11/2020 2:22 PM  Office Number [phone]

## 2020-12-11 NOTE — Patient Instructions (Signed)
  Mild OSA on CPAP --Still awaiting CPAP device. She is using a loaner --Counseled NOT to drive if/when sleepy --Advised patient to wear CPAP for at least 4 hours each night for greater than 70% of the time to avoid the machine being repossessed by insurance.  ILD --Prior radiation exposure --No evidence of pulmonary HTN on echocardiogram --Defer autoimmune work-up --Encourage regular aerobic exercise at least three times a week  Follow-up with me in 6 months

## 2020-12-13 ENCOUNTER — Encounter: Payer: Self-pay | Admitting: Pulmonary Disease

## 2021-01-01 DIAGNOSIS — H35033 Hypertensive retinopathy, bilateral: Secondary | ICD-10-CM | POA: Diagnosis not present

## 2021-01-01 DIAGNOSIS — H04123 Dry eye syndrome of bilateral lacrimal glands: Secondary | ICD-10-CM | POA: Diagnosis not present

## 2021-01-01 DIAGNOSIS — H524 Presbyopia: Secondary | ICD-10-CM | POA: Diagnosis not present

## 2021-01-01 DIAGNOSIS — H35433 Paving stone degeneration of retina, bilateral: Secondary | ICD-10-CM | POA: Diagnosis not present

## 2021-01-01 DIAGNOSIS — H25813 Combined forms of age-related cataract, bilateral: Secondary | ICD-10-CM | POA: Diagnosis not present

## 2021-01-01 DIAGNOSIS — H5203 Hypermetropia, bilateral: Secondary | ICD-10-CM | POA: Diagnosis not present

## 2021-01-01 DIAGNOSIS — H35363 Drusen (degenerative) of macula, bilateral: Secondary | ICD-10-CM | POA: Diagnosis not present

## 2021-01-01 DIAGNOSIS — I10 Essential (primary) hypertension: Secondary | ICD-10-CM | POA: Diagnosis not present

## 2021-01-01 DIAGNOSIS — H52223 Regular astigmatism, bilateral: Secondary | ICD-10-CM | POA: Diagnosis not present

## 2021-01-31 DIAGNOSIS — I10 Essential (primary) hypertension: Secondary | ICD-10-CM | POA: Diagnosis not present

## 2021-01-31 DIAGNOSIS — E785 Hyperlipidemia, unspecified: Secondary | ICD-10-CM | POA: Diagnosis not present

## 2021-01-31 DIAGNOSIS — R7301 Impaired fasting glucose: Secondary | ICD-10-CM | POA: Diagnosis not present

## 2021-03-01 ENCOUNTER — Other Ambulatory Visit: Payer: Self-pay

## 2021-03-01 ENCOUNTER — Ambulatory Visit: Payer: Medicare PPO | Admitting: Podiatry

## 2021-03-01 ENCOUNTER — Encounter: Payer: Self-pay | Admitting: Podiatry

## 2021-03-01 DIAGNOSIS — B351 Tinea unguium: Secondary | ICD-10-CM | POA: Diagnosis not present

## 2021-03-01 DIAGNOSIS — M79674 Pain in right toe(s): Secondary | ICD-10-CM

## 2021-03-01 DIAGNOSIS — M79675 Pain in left toe(s): Secondary | ICD-10-CM | POA: Diagnosis not present

## 2021-03-01 DIAGNOSIS — L84 Corns and callosities: Secondary | ICD-10-CM | POA: Diagnosis not present

## 2021-03-01 DIAGNOSIS — I059 Rheumatic mitral valve disease, unspecified: Secondary | ICD-10-CM | POA: Insufficient documentation

## 2021-03-01 NOTE — Patient Instructions (Signed)
You may purchase additional toe crests on [employer]

## 2021-03-09 NOTE — Progress Notes (Signed)
°  Subjective:  Patient ID: Sydney Wood, female    DOB: 07-24-1935,  MRN: [id number]  Sydney Wood presents to clinic today for painful elongated mycotic toenails 1-5 bilaterally which are tender when wearing enclosed shoe gear. Pain is relieved with periodic professional debridement.  New problem(s): None.   PCP is Crist Infante, MD , and last visit was October, 2022.  Allergies  Allergen Reactions   Latex Hives and Swelling   Shellfish-Derived Products Nausea And Vomiting   Simvastatin-High Dose Other (See Comments)    Muscle aches, flu like symptoms.     Review of Systems: Negative except as noted in the HPI. Objective:   Constitutional Sydney Wood is a pleasant 86 y.o. Caucasian female, in NAD. AAO x 3.   Vascular CFT immediate b/l LE. Palpable DP/PT pulses b/l LE. Digital hair sparse b/l. Skin temperature gradient WNL b/l. No pain with calf compression b/l. No edema noted b/l. No cyanosis or clubbing noted b/l LE.  Neurologic Normal speech. Oriented to person, place, and time. Protective sensation intact 5/5 intact bilaterally with 10g monofilament b/l. Vibratory sensation intact b/l.  Dermatologic Pedal integument with normal turgor, texture and tone BLE. No open wounds b/l LE. No interdigital macerations noted b/l LE. Toenails 1-5 b/l elongated, discolored, dystrophic, thickened, crumbly with subungual debris and tenderness to dorsal palpation.  Orthopedic: Muscle strength 5/5 to all lower extremity muscle groups bilaterally. No pain, crepitus or joint limitation noted with ROM bilateral LE. Hammertoe(s) noted to the bilateral 5th toes.   Radiographs: None Assessment:   1. Pain due to onychomycosis of toenails of both feet    Plan:  Patient was evaluated and treated and all questions answered. Consent given for treatment as described below: -Examined patient. -Mycotic toenails 1-5 bilaterally were debrided in length and girth with sterile nail nippers and  dremel without incident. -Patient/POA to call should there be question/concern in the interim.  Return in about 3 months (around 05/29/2021).  Marzetta Board, DPM

## 2021-03-12 DIAGNOSIS — R195 Other fecal abnormalities: Secondary | ICD-10-CM | POA: Diagnosis not present

## 2021-03-12 DIAGNOSIS — M1612 Unilateral primary osteoarthritis, left hip: Secondary | ICD-10-CM | POA: Diagnosis not present

## 2021-03-12 DIAGNOSIS — R1032 Left lower quadrant pain: Secondary | ICD-10-CM | POA: Diagnosis not present

## 2021-03-12 DIAGNOSIS — R7301 Impaired fasting glucose: Secondary | ICD-10-CM | POA: Diagnosis not present

## 2021-03-12 DIAGNOSIS — R350 Frequency of micturition: Secondary | ICD-10-CM | POA: Diagnosis not present

## 2021-03-20 ENCOUNTER — Encounter (HOSPITAL_BASED_OUTPATIENT_CLINIC_OR_DEPARTMENT_OTHER): Payer: Self-pay | Admitting: Urology

## 2021-03-20 ENCOUNTER — Emergency Department (HOSPITAL_BASED_OUTPATIENT_CLINIC_OR_DEPARTMENT_OTHER): Payer: Medicare PPO

## 2021-03-20 ENCOUNTER — Other Ambulatory Visit: Payer: Self-pay

## 2021-03-20 ENCOUNTER — Emergency Department (HOSPITAL_BASED_OUTPATIENT_CLINIC_OR_DEPARTMENT_OTHER)
Admission: EM | Admit: 2021-03-20 | Discharge: 2021-03-20 | Disposition: A | Discharge status: Home / Self Care | Payer: Medicare PPO | Attending: Emergency Medicine | Admitting: Emergency Medicine

## 2021-03-20 DIAGNOSIS — Z79899 Other long term (current) drug therapy: Secondary | ICD-10-CM | POA: Diagnosis not present

## 2021-03-20 DIAGNOSIS — E039 Hypothyroidism, unspecified: Secondary | ICD-10-CM | POA: Insufficient documentation

## 2021-03-20 DIAGNOSIS — Z853 Personal history of malignant neoplasm of breast: Secondary | ICD-10-CM | POA: Insufficient documentation

## 2021-03-20 DIAGNOSIS — R109 Unspecified abdominal pain: Secondary | ICD-10-CM | POA: Insufficient documentation

## 2021-03-20 DIAGNOSIS — Z91040 Latex allergy status: Secondary | ICD-10-CM | POA: Diagnosis not present

## 2021-03-20 DIAGNOSIS — R1032 Left lower quadrant pain: Secondary | ICD-10-CM | POA: Diagnosis not present

## 2021-03-20 LAB — URINALYSIS, ROUTINE W REFLEX MICROSCOPIC
Bilirubin Urine: NEGATIVE
Glucose, UA: NEGATIVE mg/dL
Hgb urine dipstick: NEGATIVE
Ketones, ur: NEGATIVE mg/dL
Leukocytes,Ua: NEGATIVE
Nitrite: NEGATIVE
Protein, ur: NEGATIVE mg/dL
Specific Gravity, Urine: 1.005 (ref 1.005–1.030)
pH: 7 (ref 5.0–8.0)

## 2021-03-20 LAB — COMPREHENSIVE METABOLIC PANEL
ALT: 14 U/L (ref 0–44)
AST: 19 U/L (ref 15–41)
Albumin: 4.4 g/dL (ref 3.5–5.0)
Alkaline Phosphatase: 70 U/L (ref 38–126)
Anion gap: 11 (ref 5–15)
BUN: 17 mg/dL (ref 8–23)
CO2: 24 mmol/L (ref 22–32)
Calcium: 9.6 mg/dL (ref 8.9–10.3)
Chloride: 99 mmol/L (ref 98–111)
Creatinine, Ser: 0.84 mg/dL (ref 0.44–1.00)
GFR, Estimated: >60 mL/min (ref >60)
Glucose, Bld: 107 mg/dL — ABNORMAL HIGH (ref 70–99)
Potassium: 4.1 mmol/L (ref 3.5–5.1)
Sodium: 134 mmol/L — ABNORMAL LOW (ref 135–145)
Total Bilirubin: 0.4 mg/dL (ref 0.3–1.2)
Total Protein: 7.4 g/dL (ref 6.5–8.1)

## 2021-03-20 LAB — CBC WITH DIFFERENTIAL/PLATELET
Abs Immature Granulocytes: 0.02 10*3/uL (ref 0.00–0.07)
Basophils Absolute: 0 10*3/uL (ref 0.0–0.1)
Basophils Relative: 0 %
Eosinophils Absolute: 0 10*3/uL (ref 0.0–0.5)
Eosinophils Relative: 0 %
HCT: 38.3 % (ref 36.0–46.0)
Hemoglobin: 12.6 g/dL (ref 12.0–15.0)
Immature Granulocytes: 0 %
Lymphocytes Relative: 13 %
Lymphs Abs: 1 10*3/uL (ref 0.7–4.0)
MCH: 28.9 pg (ref 26.0–34.0)
MCHC: 32.9 g/dL (ref 30.0–36.0)
MCV: 87.8 fL (ref 80.0–100.0)
Monocytes Absolute: 0.4 10*3/uL (ref 0.1–1.0)
Monocytes Relative: 5 %
Neutro Abs: 6 10*3/uL (ref 1.7–7.7)
Neutrophils Relative %: 82 %
Platelets: 200 10*3/uL (ref 150–400)
RBC: 4.36 MIL/uL (ref 3.87–5.11)
RDW: 13.4 % (ref 11.5–15.5)
WBC: 7.5 10*3/uL (ref 4.0–10.5)
nRBC: 0 % (ref 0.0–0.2)

## 2021-03-20 LAB — LIPASE, BLOOD: Lipase: 30 U/L (ref 11–51)

## 2021-03-20 MED ORDER — IOHEXOL 300 MG/ML  SOLN
80.0000 mL | Freq: Once | INTRAMUSCULAR | Status: AC | PRN
Start: 2021-03-20 — End: 2021-03-20
  Administered 2021-03-20: 80 mL via INTRAVENOUS

## 2021-03-20 NOTE — ED Notes (Signed)
POC card is at bed side.

## 2021-03-20 NOTE — Discharge Instructions (Addendum)
Laboratory tests and CT scan were reassuring today.  No signs of anemia, pancreatitis, colitis or diverticulitis.  Follow up with your primary care doctor to be rechecked and discuss further evaluation.  The CT scan did show an incidental finding in the liver.  The radiologist recommended a follow-up MRI or CT scan to reevaluate.  Discussed this with Dr. Joylene Draft

## 2021-03-20 NOTE — ED Triage Notes (Signed)
LLQ pain that started x 1 month ago  Saw PCP on 2/14, worsening since then  [facility] tarry stools x 1 week  Denies any weakness NAD at this time  A&O x 4

## 2021-03-20 NOTE — ED Provider Notes (Signed)
[facility] EMERGENCY DEPT Provider Note   CSN: [id number] Arrival date & time: 03/20/21  1305     History  Chief Complaint  Patient presents with   Abdominal Pain    Sydney Wood is a 86 y.o. female.   Abdominal Pain Patient has history of hyperlipidemia, hypothyroidism, breast cancer and prior abdominal surgeries including tubal ligation. Patient presented to the ED for evaluation of abdominal pain.  Patient states she has been having some intermittent sharp pain in the left lower quadrant for the last month.  In the last week or so she has noticed some increasing symptoms.  She also started developed dark stools.  She saw her primary care provider on the 14th who felt symptoms may be related to her hip.  Patient states she called the doctor's office today.  With her dark stools they recommended she come to the ED.  She is not having any urinary symptoms.  No vomiting or diarrhea.  No fevers.  Home Medications Prior to Admission medications   Medication Sig Start Date End Date Taking? Authorizing Provider  atorvastatin (LIPITOR) 10 MG tablet Take 10 mg by mouth daily. 12/13/13   [provider]  cholecalciferol (VITAMIN D) 400 UNITS TABS Take 400 Units by mouth daily.    [provider]  COVID-19 mRNA Vac-TriS, Pfizer, (PFIZER-BIONT COVID-19 VAC-TRIS) SUSP injection Inject into the muscle. 08/13/20   Carlyle Basques, MD  doxepin (SINEQUAN) 50 MG capsule Take 50 mg daily by mouth.    [provider]  ibuprofen (ADVIL,MOTRIN) 100 MG tablet Take 100 mg as needed by mouth for pain.     [provider]  Levothyroxine Sodium (SYNTHROID PO) Take 150 mcg by mouth daily.     [provider]  Magnesium 200 MG TABS Take 1,000 mg daily by mouth.     [provider]  Multiple Vitamin (MULTIVITAMIN) tablet Take 1 tablet by mouth daily.    [provider]  omeprazole (PRILOSEC) 20 MG capsule Take 1 capsule by mouth  daily.    [provider]  rOPINIRole (REQUIP) 0.5 MG tablet Take 0.5 mg by mouth daily. 12/24/14   [provider]  telmisartan (MICARDIS) 20 MG tablet Take 20 mg by mouth daily.     [provider]  temazepam (RESTORIL) 15 MG capsule Take 15 mg by mouth at bedtime as needed for sleep.    [provider]      Allergies    Latex, Shellfish-derived products, and Simvastatin-high dose    Review of Systems   Review of Systems  Gastrointestinal:  Positive for abdominal pain.   Physical Exam Updated Vital Signs BP (!) 107/93    Pulse 78    Temp (!) 97.5 F (36.4 C) (Oral)    Resp 12    Ht 1.6 m (5\' 3" )    Wt 88.5 kg    SpO2 94%    BMI 34.56 kg/m  Physical Exam Vitals and nursing note reviewed.  Constitutional:      General: She is not in acute distress.    Appearance: She is well-developed.  HENT:     Head: Normocephalic and atraumatic.     Right Ear: External ear normal.     Left Ear: External ear normal.  Eyes:     General: No scleral icterus.       Right eye: No discharge.        Left eye: No discharge.     Conjunctiva/sclera: Conjunctivae normal.  Neck:     Trachea: No tracheal deviation.  Cardiovascular:     Rate and Rhythm: Normal rate and regular rhythm.  Pulmonary:     Effort: Pulmonary effort is normal. No respiratory distress.     Breath sounds: Normal breath sounds. No stridor. No wheezing or rales.  Abdominal:     General: Bowel sounds are normal. There is no distension.     Palpations: Abdomen is soft.     Tenderness: There is no abdominal tenderness. There is no guarding or rebound.  Musculoskeletal:        General: No tenderness or deformity.     Cervical back: Neck supple.  Skin:    General: Skin is warm and dry.     Findings: No rash.  Neurological:     General: No focal deficit present.     Mental Status: She is alert.     Cranial Nerves: No cranial nerve deficit (no facial droop, extraocular movements intact, no  slurred speech).     Sensory: No sensory deficit.     Motor: No abnormal muscle tone or seizure activity.     Coordination: Coordination normal.  Psychiatric:        Mood and Affect: Mood normal.    ED Results / Procedures / Treatments   Labs (all labs ordered are listed, but only abnormal results are displayed) Labs Reviewed  COMPREHENSIVE METABOLIC PANEL - Abnormal; Notable for the following components:      Result Value   Sodium 134 (*)    Glucose, Bld 107 (*)    All other components within normal limits  URINALYSIS, ROUTINE W REFLEX MICROSCOPIC - Abnormal; Notable for the following components:   Color, Urine COLORLESS (*)    All other components within normal limits  LIPASE, BLOOD  CBC WITH DIFFERENTIAL/PLATELET    EKG None  Radiology CT ABDOMEN PELVIS W CONTRAST  Result Date: 03/20/2021 CLINICAL DATA:  Intermittent left lower quadrant abdominal pain for 1 month. EXAM: CT ABDOMEN AND PELVIS WITH CONTRAST TECHNIQUE: Multidetector CT imaging of the abdomen and pelvis was performed using the standard protocol following bolus administration of intravenous contrast. RADIATION DOSE REDUCTION: This exam was performed according to the departmental dose-optimization program which includes automated exposure control, adjustment of the mA and/or kV according to patient size and/or use of iterative reconstruction technique. CONTRAST:  26mL OMNIPAQUE IOHEXOL 300 MG/ML  SOLN COMPARISON:  None. FINDINGS: Lower chest: Heart is mildly enlarged. Subpleural reticular opacities identified within the visualized lower lobes. Hepatobiliary: Liver is normal in size and contour. Indeterminate 10 mm low-attenuation lesion left hepatic lobe (image 12; series 2). Subcentimeter too small to characterize low-attenuation lesion right hepatic lobe. Gallbladder is unremarkable. No intrahepatic or extrahepatic biliary ductal dilatation. Pancreas: Unremarkable Spleen: Subcentimeter too small to characterize  low-attenuation lesion within the inferior aspect of the spleen. Adrenals/Urinary Tract: Normal adrenal glands. Kidneys enhance symmetrically with contrast. No hydronephrosis. Urinary bladder is distended. Stomach/Bowel: Small hiatal hernia. Normal morphology of the stomach. No evidence for bowel obstruction. No free fluid or free intraperitoneal air. Vascular/Lymphatic: Normal caliber abdominal aorta. Peripheral calcified atherosclerotic plaque. No retroperitoneal lymphadenopathy. Reproductive: Uterus is unremarkable. Other: None. Musculoskeletal: Lumbar spine degenerative changes. No aggressive or acute appearing osseous lesions. IMPRESSION: 1. No acute process within the abdomen or pelvis. 2. Indeterminate low-attenuation lesion within the left hepatic lobe. Consider short-term follow-up CT or potentially MRI for definitive characterization. 3. Subpleural reticular opacities within the visualized lower lungs bilaterally which may represent early fibrotic change. Electronically  Signed   By: Lovey Newcomer M.D.   On: 03/20/2021 15:41    Procedures Procedures    Medications Ordered in ED Medications  iohexol (OMNIPAQUE) 300 MG/ML solution 80 mL (80 mLs Intravenous Contrast Given 03/20/21 1518)    ED Course/ Medical Decision Making/ A&P                           Medical Decision Making Amount and/or Complexity of Data Reviewed Labs: ordered. Radiology: ordered.  Risk Prescription drug management.   Patient presented to ED with complaints of abdominal pain.  Concerned about the possibility of colitis or diverticulitis.  Patient also noted some discoloration to her stools.  ED work-up is reassuring.  Patient is not anemic.  No signs of GI bleeding.  CT scan does not show any evidence of colitis diverticulitis or other concerning findings.  Incidental liver finding noted and discussed with patient.  Evaluation and diagnostic testing in the emergency department does not suggest an emergent condition  requiring admission or immediate intervention beyond what has been performed at this time.  The patient is safe for discharge and has been instructed to return immediately for worsening symptoms, change in symptoms or any other concerns.         Final Clinical Impression(s) / ED Diagnoses Final diagnoses:  Abdominal pain, unspecified abdominal location    Rx / DC Orders ED Discharge Orders     None         Dorie Rank, MD 03/20/21 1558

## 2021-03-20 NOTE — ED Notes (Signed)
Pt is aware we need urine sample.  

## 2021-04-01 ENCOUNTER — Encounter: Payer: Self-pay | Admitting: Pulmonary Disease

## 2021-04-01 ENCOUNTER — Other Ambulatory Visit: Payer: Self-pay

## 2021-04-01 ENCOUNTER — Ambulatory Visit: Payer: Medicare PPO | Admitting: Pulmonary Disease

## 2021-04-01 VITALS — BP 130/84 | HR 84 | Ht 63.0 in | Wt 187.2 lb

## 2021-04-01 DIAGNOSIS — G4733 Obstructive sleep apnea (adult) (pediatric): Secondary | ICD-10-CM

## 2021-04-01 DIAGNOSIS — J849 Interstitial pulmonary disease, unspecified: Secondary | ICD-10-CM | POA: Diagnosis not present

## 2021-04-01 NOTE — Progress Notes (Signed)
Subjective:   PATIENT ID: Sydney Wood GENDER: female DOB: 1936/01/04, MRN: [id number]   HPI  Chief Complaint  Patient presents with   Follow-up    Cpap compliance    Reason for Visit: Follow-up   [name] is an 86 year old female with OSA on CPAP, history of right breast cancer s/p lumpectomy and radiation in remission, hypertension, hyperlipidemia, hypothyroidism, reflux who presents for follow-up.  Synopsis: Initially referred by PCP Dr. Joylene Draft on 01/30/2020 for abnormal CT chest demonstrating extra mucus in the airways and scarring. She reports long standing history of shortness of breath with exertion with short-medium distances. She gets out of breath after walking a flight of stairs. She will need to rest after walking in the grocery store after 10 minutes. She is unsure of when her symptoms began but it has been gradually worsening and now associated with a cough. She describes having a non-productive cough. No wheezing. Has not measured oxygen levels at home.  2022 - Diagnosed with ILD with associated restrictive defect on PFTs. Treated for flare in June with prednisone. CT with slight progression of GGO.  12/26/20 Overall, she feels stable. No limitations in activity. Denies shortness of breath, cough or wheezing. She had an echocardiogram for murmur. She is compliant with her CPAP nightly 6-8 hours which she reports benefit in sleep quality.   04/01/21 Since her last visit she denies shortness of breath, cough or wheezing. She compliant with CPAP nightly and reports her sleep has improved. She is taking Ozempic for her weight loss and has lost 7 lbs and noticing that she is fitting her clothing better.  Social History: Former smoker. Quit in 1972. Smoked 1/2 in her 75s beginning in college. 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 Had to evacuate one room due to asbestos Husband hospitalized in 09/2020 for cardiac cath  Environmental  exposures:  Radiation                                                                                                                                                      Past Medical History:  Diagnosis Date   Breast cancer ([place])    GERD (gastroesophageal reflux disease)    Hyperlipidemia    Hypothyroidism    Indigestion    S/P radiation therapy 06/14/14-07/12/14   right breast 50Gy total dose   Sleep apnea    CPAP   Syncope and collapse 2006   Wears glasses    Wears partial dentures     Allergies  Allergen Reactions   Latex Hives and Swelling   Shellfish-Derived Products Nausea And Vomiting   Simvastatin-High Dose Other (See Comments)    Muscle aches, flu like symptoms.      Outpatient Medications Prior to Visit  Medication Sig Dispense Refill  acetaminophen (TYLENOL) 325 MG tablet Take 650 mg by mouth every 6 (six) hours as needed.     atorvastatin (LIPITOR) 10 MG tablet Take 10 mg by mouth daily.  1   cholecalciferol (VITAMIN D) 400 UNITS TABS Take 400 Units by mouth daily.     doxepin (SINEQUAN) 50 MG capsule Take 50 mg daily by mouth.     Levothyroxine Sodium (SYNTHROID PO) Take 150 mcg by mouth daily.      Magnesium 200 MG TABS Take 1,000 mg daily by mouth.      Multiple Vitamin (MULTIVITAMIN) tablet Take 1 tablet by mouth daily.     omeprazole (PRILOSEC) 20 MG capsule Take 1 capsule by mouth daily.     rOPINIRole (REQUIP) 0.5 MG tablet Take 0.5 mg by mouth daily.  0   Semaglutide,0.25 or 0.5MG/DOS, (OZEMPIC, 0.25 OR 0.5 MG/DOSE,) 2 MG/1.5ML SOPN INJECT 0.25MG UNDER THE SKIN ONCE WEEKLY FOR 2 WEEKS, THEN INCREASE TO 0.5MG WEEKLY X 3 WEEKS, THEN START 1MG     telmisartan (MICARDIS) 20 MG tablet Take 20 mg by mouth daily.      temazepam (RESTORIL) 15 MG capsule Take 15 mg by mouth at bedtime as needed for sleep.     COVID-19 mRNA Vac-TriS, Pfizer, (PFIZER-BIONT COVID-19 VAC-TRIS) SUSP injection Inject into the muscle. (Patient not taking: Reported on 04/01/2021) 0.3 mL 0    ibuprofen (ADVIL,MOTRIN) 100 MG tablet Take 100 mg as needed by mouth for pain.      No facility-administered medications prior to visit.    Review of Systems  Constitutional:  Negative for chills, diaphoresis, fever, malaise/fatigue and weight loss.  HENT:  Negative for congestion.   Respiratory:  Negative for cough, hemoptysis, sputum production, shortness of breath and wheezing.   Cardiovascular:  Negative for chest pain, palpitations and leg swelling.    Objective:   Vitals:   04/01/21 1433  BP: 130/84  Pulse: 84  SpO2: 96%  Weight: 187 lb 3.2 oz (84.9 kg)  Height: _0  (1.6 m)   SpO2: 96 % O2 Device: None (Room air)  Physical Exam: General: Well-appearing, no acute distress HENT: [place], AT Eyes: EOMI, no scleral icterus Respiratory: Clear to auscultation bilaterally.  No crackles, wheezing or rales Cardiovascular: RRR, -M/R/G, no JVD Extremities:-Edema,-tenderness Neuro: AAO x4, CNII-XII grossly intact Psych: Normal mood, normal affect  Data Reviewed:  Imaging: CT chest HR 02/21/2020-basilar and subpleural groundglass with minimal reticulation and single-layer honeycombing notable in the right middle lobe.  Mosaic attenuation.  Traction bronchiectasis present.  Enlarged PA suggestive of pulmonary arterial hypertension. CT Chest 10/26/20 - Slight progression of GGO with minimal reticulation and septal thickening. Possible early honeycombing   PFT: 06/28/20 FVC 2.15 (91%) FEV1 1.57 (90%) Ratio 86  TLC 72% DLCO 93% Interpretation: Mild restrictive defect. Normal DLCO  Sleep test 08/21/20 HST - AHI 8.7 SpO2 nadir 83%. Recommend auto CPAP 5-15 cm H20  Labs: [facility] [employer] [facility] [employer] labs Basic metabolic panel reviewed.  12/07/2019 Sodium 129, glucose 131.  Remaining values within normal limits CBC reviewed 10/25/2019 WBC 6.35 Absolute eosinophils 100  Echocardiogram: 11/12/20 - EF 63%. Indeterminate mitral regurg, grade I DD  02/27/21-03/28/21 Days usage 30/30 days  100% >4 hours 100% Auto  5-15.  AHI 0.4     Assessment & Plan:   Discussion: 86 year old female with OSA on CPAP, hx of right breast cancer s/p lumpectomy and radiation in remission who presents for follow-up. Reviewed CPAP compliance report with 100% compliance.  She is currently  asymptomatic from an ILD standpoint with last flare requiring prednisone in June 2022.  Her CT scan in the fall demonstrated slight progression of her ILD.  Plan to repeat PFTs in CT imaging in September 2023.  We discussed clinical course and management of ILD.  Not on bronchodilators due to ineffectiveness and side effects (failed Breo).  OSA The natural history, progression and prognosis of sleep apnea, treatment with PAP and alternative treatment strategies were discussed. The patient was also educated regarding the long term cardiovascular benefits of treating sleep apnea, including improved blood pressure control, reduction in MI and stroke risk as well as other potential benefits of treatment, such as improved glycemic control, facilitation of weight loss, improved energy during the day and improved sleep quality. --Counseled on sleep hygiene --Counseled on weight loss/maintenance of healthy weight --Counseled NOT to drive if/when sleepy --Advised patient to wear CPAP for at least 4 hours each night for greater than 70% of the time to avoid the machine being repossessed by insurance.  ILD, unknown etiology --Prior radiation exposure however bilateral involvement not consistent. --No evidence of pulmonary HTN on echocardiogram --Defer autoimmune work-up --Encourage regular aerobic exercise at least three times a week --ORDER HR CT Chest without contrast in Sept 2023 --RECALL for pulmonary function test in Sept 2023  Chronic cough secondary ILD - resolved --No benefit from [facility] and had myriad of side effects (bloated, blurred vision, hot flashes and tingling feeling in legs and ear pressure). Unclear if true  effects related to inhaler  Health Maintenance Immunization History  Administered Date(s) Administered   Fluad Quad(high Dose 65+) 10/29/2020   Influenza Split 11/08/2008, 11/20/2009, 11/07/2010, 02/06/2011, 11/18/2011, 05/16/2013, 10/14/2013   Influenza, High Dose Seasonal PF 11/07/2015, 11/11/2016   Influenza, Quadrivalent, Recombinant, Inj, Pf 09/17/2017, 10/28/2018, 11/26/2019, 11/23/2020   Influenza,inj,Quad PF,6+ Mos 11/01/2012, 10/14/2013, 10/28/2014   PFIZER Comirnaty(Gray Top)Covid-19 Tri-Sucrose Vaccine 08/13/2020   PFIZER(Purple Top)SARS-COV-2 Vaccination 02/06/2019, 02/26/2019, 11/01/2019   Pneumococcal Conjugate-13 09/08/2013   Pneumococcal Polysaccharide-23 09/16/2001, 12/11/2008, 02/06/2011   Td 08/29/2002, 02/06/2011, 04/29/2012   Tdap 04/29/2012, 08/17/2012   Zoster Recombinat (Shingrix) 06/29/2017, 08/28/2017   Zoster, Live 02/25/2005, 02/06/2011, 08/28/2017   CT Lung Screen - not a candidate. Insufficient smoking history  Orders Placed This Encounter  Procedures   CT Chest High Resolution    Standing Status:   Future    Standing Expiration Date:   04/02/2022    Scheduling Instructions:     Completed for Sept OV    Order Specific Question:   Preferred imaging location?    Answer:   [place]   Pulmonary function test    Standing Status:   Future    Standing Expiration Date:   04/02/2022    Scheduling Instructions:     Same day as OV    Order Specific Question:   Where should this test be performed?    Answer:   [facility]    Order Specific Question:   Full PFT: includes the following: basic spirometry, spirometry pre & post bronchodilator, diffusion capacity (DLCO), lung volumes    Answer:   Full PFT   No orders of the defined types were placed in this encounter.  Return in about 6 months (around 10/02/2021).  I have spent a total time of 34-minutes on the day of the appointment reviewing prior documentation, coordinating care and discussing  medical diagnosis and plan with the patient/family. Past medical history, allergies, medications were reviewed. Pertinent imaging, labs and tests included  in this note have been reviewed and interpreted independently by me.  [place], MD [facility] 04/01/2021 2:53 PM  Office Number [phone]

## 2021-04-01 NOTE — Patient Instructions (Addendum)
OSA ?--Counseled on sleep hygiene ?--Counseled on weight loss/maintenance of healthy weight ?--Counseled NOT to drive if/when sleepy ?--Advised patient to wear CPAP for at least 4 hours each night for greater than 70% of the time to avoid the machine being repossessed by insurance. ? ?ILD, unknown etiology ?--RECALL for pulmonary function test in Sept 2023 ?--ORDER HR CT Chest without contrast in Sept 2023 ? ?Follow-up with me in Sept 2023 ?Cancel May 2023 appointment ?

## 2021-04-05 DIAGNOSIS — L814 Other melanin hyperpigmentation: Secondary | ICD-10-CM | POA: Diagnosis not present

## 2021-04-05 DIAGNOSIS — D485 Neoplasm of uncertain behavior of skin: Secondary | ICD-10-CM | POA: Diagnosis not present

## 2021-04-05 DIAGNOSIS — I788 Other diseases of capillaries: Secondary | ICD-10-CM | POA: Diagnosis not present

## 2021-04-05 DIAGNOSIS — L720 Epidermal cyst: Secondary | ICD-10-CM | POA: Diagnosis not present

## 2021-04-05 DIAGNOSIS — L570 Actinic keratosis: Secondary | ICD-10-CM | POA: Diagnosis not present

## 2021-04-05 DIAGNOSIS — L821 Other seborrheic keratosis: Secondary | ICD-10-CM | POA: Diagnosis not present

## 2021-05-28 DIAGNOSIS — E162 Hypoglycemia, unspecified: Secondary | ICD-10-CM | POA: Diagnosis not present

## 2021-05-28 DIAGNOSIS — E785 Hyperlipidemia, unspecified: Secondary | ICD-10-CM | POA: Diagnosis not present

## 2021-05-28 DIAGNOSIS — R7301 Impaired fasting glucose: Secondary | ICD-10-CM | POA: Diagnosis not present

## 2021-05-28 DIAGNOSIS — M8580 Other specified disorders of bone density and structure, unspecified site: Secondary | ICD-10-CM | POA: Diagnosis not present

## 2021-05-28 DIAGNOSIS — E039 Hypothyroidism, unspecified: Secondary | ICD-10-CM | POA: Diagnosis not present

## 2021-05-28 DIAGNOSIS — K769 Liver disease, unspecified: Secondary | ICD-10-CM | POA: Diagnosis not present

## 2021-05-28 DIAGNOSIS — I10 Essential (primary) hypertension: Secondary | ICD-10-CM | POA: Diagnosis not present

## 2021-05-28 DIAGNOSIS — J849 Interstitial pulmonary disease, unspecified: Secondary | ICD-10-CM | POA: Diagnosis not present

## 2021-05-28 DIAGNOSIS — K219 Gastro-esophageal reflux disease without esophagitis: Secondary | ICD-10-CM | POA: Diagnosis not present

## 2021-05-30 ENCOUNTER — Ambulatory Visit: Payer: Medicare PPO | Admitting: Pulmonary Disease

## 2021-05-31 ENCOUNTER — Ambulatory Visit: Payer: Medicare PPO | Admitting: Podiatry

## 2021-05-31 ENCOUNTER — Encounter: Payer: Self-pay | Admitting: Cardiovascular Disease

## 2021-05-31 ENCOUNTER — Ambulatory Visit: Payer: Medicare PPO | Admitting: Cardiovascular Disease

## 2021-05-31 VITALS — BP 136/74 | HR 78 | Ht 64.0 in | Wt 188.4 lb

## 2021-05-31 DIAGNOSIS — I10 Essential (primary) hypertension: Secondary | ICD-10-CM

## 2021-05-31 DIAGNOSIS — E118 Type 2 diabetes mellitus with unspecified complications: Secondary | ICD-10-CM

## 2021-05-31 DIAGNOSIS — E782 Mixed hyperlipidemia: Secondary | ICD-10-CM

## 2021-05-31 NOTE — Patient Instructions (Signed)
Medication Instructions:  ?Your physician recommends that you continue on your current medications as directed. Please refer to the Current Medication list given to you today. ? ?*If you need a refill on your cardiac medications before your next appointment, please call your pharmacy* ? ? ?Lab Work: ?NONE ?If you have labs (blood work) drawn today and your tests are completely normal, you will receive your results only by: ?MyChart Message (if you have MyChart) OR ?A paper copy in the mail ?If you have any lab test that is abnormal or we need to change your treatment, we will call you to review the results. ? ? ?Testing/Procedures: ?NONE ? ? ?Follow-Up: ?At [facility], you and your health needs are our priority.  As part of our continuing mission to provide you with exceptional heart care, we have created designated Provider Care Teams.  These Care Teams include your primary Cardiologist (physician) and Advanced Practice Providers (APPs -  Physician Assistants and Nurse Practitioners) who all work together to provide you with the care you need, when you need it. ? ?We recommend signing up for the patient portal called "MyChart".  Sign up information is provided on this After Visit Summary.  MyChart is used to connect with patients for Virtual Visits (Telemedicine).  Patients are able to view lab/test results, encounter notes, upcoming appointments, etc.  Non-urgent messages can be sent to your provider as well.   ?To learn more about what you can do with MyChart, go to [email or web address].   ? ?Your next appointment:   ?1 year(s) ? ?The format for your next appointment:   ?In Person ? ?Provider:   ?Mertie Moores, MD  or Robbie Lis, PA-C, Christen Bame, NP, or Richardson Dopp, PA-C       ? ? ?  ? ?Important Information About Sugar ? ? ? ? ?  ?

## 2021-05-31 NOTE — Progress Notes (Signed)
Sydney Wood ?Date of Birth  1935/06/04 ?[facility] ?[address] ?[place], [place]  [address] ?  ? ?Problem List ?1. Hypertension ?2. Hyperlipidemia ?3. Sleep apnea ?4. Breast Cancer  ? ? ?86 yo with a hx of presyncope, hyperlipidemia, sleep apnea.  She has been under a lot of stress recently. Her sister was diagnosed with stage III inflammatory breast cancer. She has been going to [facility] a regular basis to help her sister with chemotherapy and other related issues.  She denies any chest pain, shortness breath, syncope, or presyncope. ? ?She has a history of hypercholesterolemia. Her cholesterol levels are followed by Dr. Joylene Draft. ? ?She brought her blood pressure log with her today. All of her readings are acceptable. ? ?She brought labs were drawn at Dr. Lindell Noe his office. All of her numbers are quite good. Her a protein B. level is 67 which is quite good. Her total cholesterol is 150. The triglyceride level is 129. The HDL is 42. Her LDL is 82. ? ?She's not had any episodes of chest pain or shortness breath. ? ?Nov. 17, 2014: ?Feeling well.  BP at home is usually normal.  Still traveling lots - helping her sister with breast cancer.  Exercising a bit. tries to walk in the neighborhood.  ? ?Nov. 24, 2015: ? ?Sydney Wood is doing well.  Tries to exercise.  BP readings are ok ?No CP or dyspnea.  ? ?Nov. 28, 2016: ? ?  Has been diagnosed with right breast breast cancer ( DCIS, stage 0)  ?Had lumpectomy and 20 XRT treatments. ?Has had left shoulder problems  ?Was also found to have Meralgia Paresthetica  - affects her sciatic nerve and left leg pain and tingling .  ?Is now getting back some energy  ? ?April 22, 2016: ? ?Doing well.   BP has been a little elevated   ?Feeling well  ?Labs from [facility] look good.  ?Her total cholesterol is 133.  ?The triglyceride level is 94.  ?HDL level is 41.  ?The LDL is 73. ? ?Nov. 15, 2018:   ? ?Doing ok ?We started [place] last visit.    Thinks her vision is not as good ,  More blurred vision ?Walks on occasion ? ?July 21, 2017: ?Doing well . ?Taking her BP at home  -  BP is well controlled at home  ?No CP or dyspnea.  ?Lipids managed by Dr. Joylene Draft  ?Is on Telmasartan - tolerating it well  ? ?Oct. 1, 2020  ? ?Sydney Wood is seen today for follow-up of her hypertension and hyperlipidemia. ? ?Recently saw Dr. Joylene Draft,  ?BP was up so he increased the Telmasartan  ?Feeling well,  Not exercising much ?Her TSH was low so her synthroid dose was reduced.  ( which may explain her HR being a bit high) ? ? ?Oct. 4, 2021: ?Sydney Wood is seen today for follow up of her HTN and HLD  ?BP is normal at home ? ?No CP or dyspnea ?Has some DOE,  Dose not exercise .   She tries to walk on occasion  ?Sees Dr. Joylene Draft for her lipid management.  ? ?Oct. 3, 2022 ?Sydney Wood is seen today for follow up of her HTN and HLD  ?Has developed Idopathic pulmonary fibrosis ?Possibly caused by radiation from her breast radiation ?Is seeing pulmonary  .  Dr. Loanne Drilling may want a right heart cath if her symptoms worsen. ? ?Labs from Dr. Silvestre Mesi office from September,  2021 show a total cholesterol of 140, triglyceride level is 126, HDL is 43, LDL is 72. ?Sodium level is 129.  Her HCTZ was discontinued following this lab result.  Potassium is normal.  Liver functions are normal.  Creatinine 0.09 TSH was 0.16 which is normal ? ?May 31, 2021: ?Sydney Wood is seen today for follow-up of her hypertension, hyperlipidemia.  She also has a history of idiopathic pulmonary fibrosis, possibly caused by radiation from her right breast cancer. ?Her pulmonologist has considered may be a right heart catheterization if her symptoms worsen. ?She has hyperlipidemia which has been managed by Dr. Silvestre Mesi office. ? ? ?Breathing seems to be stable  ? ?No Cp ,  ?Chronic dyspnea  ? ?Echo from Oct. 2022 shows normal LV function , mild - moderate MR .  ? ?She did not tolerate Ozempic.  It caused too many side effects. ? ?Current  Outpatient Medications on File Prior to Visit  ?Medication Sig Dispense Refill  ? acetaminophen (TYLENOL) 325 MG tablet Take 650 mg by mouth every 6 (six) hours as needed.    ? atorvastatin (LIPITOR) 10 MG tablet Take 10 mg by mouth daily.  1  ? cholecalciferol (VITAMIN D) 400 UNITS TABS Take 400 Units by mouth daily.    ? doxepin (SINEQUAN) 50 MG capsule Take 50 mg daily by mouth.    ? Levothyroxine Sodium (SYNTHROID PO) Take 150 mcg by mouth daily.     ? Magnesium 200 MG TABS Take 1,000 mg daily by mouth.     ? Multiple Vitamin (MULTIVITAMIN) tablet Take 1 tablet by mouth daily.    ? omeprazole (PRILOSEC) 20 MG capsule Take 1 capsule by mouth daily.    ? rOPINIRole (REQUIP) 0.5 MG tablet Take 0.5 mg by mouth daily.  0  ? telmisartan (MICARDIS) 20 MG tablet Take 20 mg by mouth daily.     ? temazepam (RESTORIL) 15 MG capsule Take 15 mg by mouth at bedtime as needed for sleep.    ? ?No current facility-administered medications on file prior to visit.  ? ? ?Allergies  ?Allergen Reactions  ? Latex Hives and Swelling  ? Shellfish-Derived Products Nausea And Vomiting  ? Simvastatin-High Dose Other (See Comments)  ?  Muscle aches, flu like symptoms.   ? ? ?Past Medical History:  ?Diagnosis Date  ? Breast cancer ([place])   ? GERD (gastroesophageal reflux disease)   ? Hyperlipidemia   ? Hypothyroidism   ? Indigestion   ? S/P radiation therapy 06/14/14-07/12/14  ? right breast 50Gy total dose  ? Sleep apnea   ? CPAP  ? Syncope and collapse 2006  ? Wears glasses   ? Wears partial dentures   ? ? ?Past Surgical History:  ?Procedure Laterality Date  ? BREAST LUMPECTOMY WITH RADIOACTIVE SEED LOCALIZATION Right 05/18/2014  ? Procedure: BREAST LUMPECTOMY WITH RADIOACTIVE SEED LOCALIZATION;  Surgeon: Rolm Bookbinder, MD;  Location: [facility];  Service: General;  Laterality: Right;  ? CARDIOVASCULAR STRESS TEST  12/17/2004  ? EF 73%. NO EVIDENCE OF ISCHEMIA  ? CARPAL TUNNEL RELEASE    ? LEFT HAND  ? COLONOSCOPY    ?  DILATION AND CURETTAGE OF UTERUS    ? x2  ? TUBAL LIGATION    ? US ECHOCARDIOGRAPHY  12/12/2004  ? EF 55-60%  ? ? ?Social History  ? ?Tobacco Use  ?Smoking Status Former  ? Packs/day: 0.50  ? Years: 15.00  ? Pack years: 7.50  ? Types: Cigarettes  ? Quit  date: 01/27/1970  ? Years since quitting: 51.3  ?Smokeless Tobacco Never  ? ? ?Social History  ? ?Substance and Sexual Activity  ?Alcohol Use No  ? ? ?Family History  ?Problem Relation Age of Onset  ? Coronary artery disease Father   ? Heart attack Father   ? Breast cancer Sister   ? Lymphoma Brother   ? Breast cancer Paternal Grandmother   ? Colon cancer Neg Hx   ? Stomach cancer Neg Hx   ? ? ?Reviw of Systems:  ?Noted in current history, otherwise review of systems is negative. ? ? ?Physical Exam: ?Blood pressure 136/74, pulse 78, height '5\' 4"'$  (1.626 m), weight 188 lb 6.4 oz (85.5 kg), SpO2 95 %. ? ?GEN:  Well nourished,  moderately obese female,  in no acute distress ?HEENT: Normal ?NECK: No JVD; No carotid bruits ?LYMPHATICS: No lymphadenopathy ?CARDIAC: RRR , soft systolic murur  ?RESPIRATORY:  occasional inspiratory squeek.  ?ABDOMEN: Soft, non-tender, non-distended ?MUSCULOSKELETAL:  No edema; No deformity  ?SKIN: Warm and dry ?NEUROLOGIC:  Alert and oriented x 3 ? ? ?ECG:     ? ? ?Assessment / Plan:  ? ?1. Hypertension -    BP is well controlled , continue meds . ? ? ? ?2. Hyperlipidemia-managed by Dr. Haynes Kerns. ? ?3.  Interstitial lung disease:  stable ,  no dyspnea  ? ? ?  ?  ? ? ?Mertie Moores, MD  ?05/31/2021 2:04 PM    ?[facility] ?[address],  [address] ?[place], [place]  [address] ?Pager [phone] ?Phone: [phone]; Fax: [phone]  ?  ? ?

## 2021-06-07 ENCOUNTER — Ambulatory Visit: Payer: Medicare PPO | Admitting: Podiatry

## 2021-06-07 ENCOUNTER — Encounter: Payer: Self-pay | Admitting: Podiatry

## 2021-06-07 DIAGNOSIS — M79675 Pain in left toe(s): Secondary | ICD-10-CM

## 2021-06-07 DIAGNOSIS — M79674 Pain in right toe(s): Secondary | ICD-10-CM

## 2021-06-07 DIAGNOSIS — B351 Tinea unguium: Secondary | ICD-10-CM

## 2021-06-16 NOTE — Progress Notes (Signed)
  Subjective:  Patient ID: Sydney Wood, female    DOB: 1935-06-11,  MRN: [id number]  86 y.o. female presents painful elongated mycotic toenails 1-5 bilaterally which are tender when wearing enclosed shoe gear. Pain is relieved with periodic professional debridement.  New problem(s): None   PCP is Crist Infante, MD , and last visit was 05/28/2021.  Allergies  Allergen Reactions   Latex Hives and Swelling   Shellfish-Derived Products Nausea And Vomiting   Simvastatin-High Dose Other (See Comments)    Muscle aches, flu like symptoms.     Review of Systems: Negative except as noted in the HPI.   Objective:  Vascular Examination: Vascular status intact b/l with palpable pedal pulses. CFT immediate b/l. No edema. No pain with calf compression b/l. Skin temperature gradient WNL b/l.   Neurological Examination: Sensation grossly intact b/l with 10 gram monofilament. Vibratory sensation intact b/l.   Dermatological Examination: Pedal skin with normal turgor, texture and tone b/l. Toenails 1-5 b/l thick, discolored, elongated with subungual debris and pain on dorsal palpation. No hyperkeratotic lesions noted b/l.   Musculoskeletal Examination: Muscle strength 5/5 to b/l LE. Hammertoe(s) noted to the bilateral 5th toes.  Radiographs: None   Assessment:   1. Pain due to onychomycosis of toenails of both feet     Plan:  -Examined patient. -Patient to continue soft, supportive shoe gear daily. -Toenails 1-5 b/l were debrided in length and girth with sterile nail nippers and dremel without iatrogenic bleeding.  -Patient/POA to call should there be question/concern in the interim.  Return in about 3 months (around 09/07/2021).  Marzetta Board, DPM

## 2021-07-04 ENCOUNTER — Other Ambulatory Visit: Payer: Self-pay | Admitting: Internal Medicine

## 2021-07-04 DIAGNOSIS — K769 Liver disease, unspecified: Secondary | ICD-10-CM

## 2021-07-29 DIAGNOSIS — Z1231 Encounter for screening mammogram for malignant neoplasm of breast: Secondary | ICD-10-CM | POA: Diagnosis not present

## 2021-08-01 ENCOUNTER — Ambulatory Visit
Admission: RE | Admit: 2021-08-01 | Discharge: 2021-08-01 | Disposition: A | Discharge status: Home / Self Care | Payer: Medicare PPO | Source: Ambulatory Visit | Attending: Internal Medicine | Admitting: Internal Medicine

## 2021-08-01 DIAGNOSIS — K429 Umbilical hernia without obstruction or gangrene: Secondary | ICD-10-CM | POA: Diagnosis not present

## 2021-08-01 DIAGNOSIS — Z853 Personal history of malignant neoplasm of breast: Secondary | ICD-10-CM | POA: Diagnosis not present

## 2021-08-01 DIAGNOSIS — K769 Liver disease, unspecified: Secondary | ICD-10-CM

## 2021-08-01 DIAGNOSIS — K449 Diaphragmatic hernia without obstruction or gangrene: Secondary | ICD-10-CM | POA: Diagnosis not present

## 2021-08-01 DIAGNOSIS — K7689 Other specified diseases of liver: Secondary | ICD-10-CM | POA: Diagnosis not present

## 2021-08-01 MED ORDER — IOPAMIDOL (ISOVUE-300) INJECTION 61%
100.0000 mL | Freq: Once | INTRAVENOUS | Status: AC | PRN
  Administered 2021-08-01: 100 mL via INTRAVENOUS

## 2021-09-16 ENCOUNTER — Encounter: Payer: Self-pay | Admitting: Podiatry

## 2021-09-16 ENCOUNTER — Ambulatory Visit: Payer: Medicare PPO | Admitting: Podiatry

## 2021-09-16 DIAGNOSIS — B351 Tinea unguium: Secondary | ICD-10-CM

## 2021-09-16 DIAGNOSIS — M79675 Pain in left toe(s): Secondary | ICD-10-CM | POA: Diagnosis not present

## 2021-09-16 DIAGNOSIS — M79674 Pain in right toe(s): Secondary | ICD-10-CM | POA: Diagnosis not present

## 2021-09-19 ENCOUNTER — Ambulatory Visit
Admission: RE | Admit: 2021-09-19 | Discharge: 2021-09-19 | Disposition: A | Discharge status: Home / Self Care | Payer: Medicare PPO | Source: Ambulatory Visit | Attending: Pulmonary Disease | Admitting: Pulmonary Disease

## 2021-09-19 DIAGNOSIS — Z853 Personal history of malignant neoplasm of breast: Secondary | ICD-10-CM | POA: Diagnosis not present

## 2021-09-19 DIAGNOSIS — J479 Bronchiectasis, uncomplicated: Secondary | ICD-10-CM | POA: Diagnosis not present

## 2021-09-19 DIAGNOSIS — J849 Interstitial pulmonary disease, unspecified: Secondary | ICD-10-CM

## 2021-09-19 DIAGNOSIS — K449 Diaphragmatic hernia without obstruction or gangrene: Secondary | ICD-10-CM | POA: Diagnosis not present

## 2021-09-19 DIAGNOSIS — J949 Pleural condition, unspecified: Secondary | ICD-10-CM | POA: Diagnosis not present

## 2021-09-23 NOTE — Progress Notes (Signed)
  Subjective:  Patient ID: Sydney Wood, female    DOB: Jan 19, 1936,  MRN: [id number]  TAMBER BURTCH presents to clinic today for painful elongated mycotic toenails 1-5 bilaterally which are tender when wearing enclosed shoe gear. Pain is relieved with periodic professional debridement.  New problem(s): None.   PCP is Crist Infante, MD , and last visit was May, 2023.  Allergies  Allergen Reactions   Lansoprazole     Other reaction(s): tongue burning   Latex Hives and Swelling   Omeprazole     Other reaction(s): doesn't work   Ropinirole     Other reaction(s): terrible side effects.   Semaglutide     Other reaction(s): nausea, gerd and bowel issues   Shellfish-Derived Products Nausea And Vomiting   Simvastatin-High Dose Other (See Comments)    Muscle aches, flu like symptoms.     Review of Systems: Negative except as noted in the HPI. Objective:   Constitutional Sydney Wood is a pleasant 86 y.o.  female, WD, WN in NAD. AAO x 3.   Vascular Capillary refill time to digits immediate b/l. Palpable DP pulse(s) b/l LE. Palpable PT pulse(s) b/l LE. Pedal hair absent. No pain with calf compression b/l. Lower extremity skin temperature gradient within normal limits. No edema noted b/l LE. No cyanosis or clubbing noted b/l LE.  Neurologic Normal speech. Oriented to person, place, and time.   Dermatologic Pedal integument with normal turgor, texture and tone b/l LE. No open wounds b/l. No interdigital macerations b/l. Toenails 1-5 b/l elongated, thickened, discolored with subungual debris. +Tenderness with dorsal palpation of nailplates. Hyperkeratotic lesion(s) medial IPJ of left great toe, medial IPJ of right great toe, and submet head 1 b/l.  No erythema, no edema, no drainage, no fluctuance.  Orthopedic: Muscle strength 5/5 to all lower extremity muscle groups bilaterally. No pain, crepitus or joint limitation noted with ROM bilateral LE. Hammertoe(s) noted to the bilateral 5th  toes.   Radiographs: None  Assessment:   1. Pain due to onychomycosis of toenails of both feet    Plan:  Patient was evaluated and treated and all questions answered. Consent given for treatment as described below: -Patient was evaluated and treated. All patient's and/or POA's questions/concerns answered on today's visit. -Patient to continue soft, supportive shoe gear daily. -Mycotic toenails 1-5 bilaterally were debrided in length and girth with sterile nail nippers and dremel without incident. -As a courtesy, callus(es) bilateral great toes and submet head 1 b/l pared utilizing sterile scalpel blade without complication or incident. Total number pared=4. -Patient/POA to call should there be question/concern in the interim.  Return in about 3 months (around 12/17/2021).  Marzetta Board, DPM

## 2021-10-01 ENCOUNTER — Encounter: Payer: Self-pay | Admitting: Pulmonary Disease

## 2021-10-01 ENCOUNTER — Ambulatory Visit (INDEPENDENT_AMBULATORY_CARE_PROVIDER_SITE_OTHER): Payer: Medicare PPO | Admitting: Pulmonary Disease

## 2021-10-01 ENCOUNTER — Ambulatory Visit: Payer: Medicare PPO | Admitting: Pulmonary Disease

## 2021-10-01 VITALS — BP 150/88 | HR 96 | Ht 63.0 in | Wt 195.0 lb

## 2021-10-01 DIAGNOSIS — G4733 Obstructive sleep apnea (adult) (pediatric): Secondary | ICD-10-CM | POA: Diagnosis not present

## 2021-10-01 DIAGNOSIS — J849 Interstitial pulmonary disease, unspecified: Secondary | ICD-10-CM | POA: Diagnosis not present

## 2021-10-01 DIAGNOSIS — R942 Abnormal results of pulmonary function studies: Secondary | ICD-10-CM

## 2021-10-01 LAB — PULMONARY FUNCTION TEST
DL/VA % pred: 104 %
DL/VA: 4.25 ml/min/mmHg/L
DLCO cor % pred: 79 %
DLCO cor: 14.29 ml/min/mmHg
DLCO unc % pred: 79 %
DLCO unc: 14.29 ml/min/mmHg
FEF 25-75 Post: 1.89 L/sec
FEF 25-75 Pre: 1.66 L/sec
FEF2575-%Change-Post: 13 %
FEF2575-%Pred-Post: 165 %
FEF2575-%Pred-Pre: 144 %
FEV1-%Change-Post: 6 %
FEV1-%Pred-Post: 104 %
FEV1-%Pred-Pre: 97 %
FEV1-Post: 1.78 L
FEV1-Pre: 1.67 L
FEV1FVC-%Change-Post: 2 %
FEV1FVC-%Pred-Pre: 114 %
FEV6-%Change-Post: 3 %
FEV6-%Pred-Post: 95 %
FEV6-%Pred-Pre: 92 %
FEV6-Post: 2.07 L
FEV6-Pre: 2 L
FEV6FVC-%Pred-Post: 106 %
FEV6FVC-%Pred-Pre: 106 %
FVC-%Change-Post: 3 %
FVC-%Pred-Post: 89 %
FVC-%Pred-Pre: 86 %
FVC-Post: 2.07 L
FVC-Pre: 2 L
Post FEV1/FVC ratio: 86 %
Post FEV6/FVC ratio: 100 %
Pre FEV1/FVC ratio: 84 %
Pre FEV6/FVC Ratio: 100 %

## 2021-10-01 NOTE — Patient Instructions (Signed)
Full PFT performed today. °

## 2021-10-01 NOTE — Progress Notes (Signed)
Subjective:   PATIENT ID: Sydney Wood GENDER: female DOB: 1935/09/11, MRN: [id number]   HPI  Chief Complaint  Patient presents with   Follow-up    PFT   Reason for Visit: Follow-up   Ms. Stefana Lodico is an 86 year old female with OSA on CPAP, history of right breast cancer s/p lumpectomy and radiation in remission, hypertension, hyperlipidemia, hypothyroidism, reflux who presents for follow-up.  Synopsis: Initially referred by PCP Dr. Joylene Draft on 01/30/2020 for abnormal CT chest demonstrating extra mucus in the airways and scarring. She reports long standing history of shortness of breath with exertion with short-medium distances. She gets out of breath after walking a flight of stairs. She will need to rest after walking in the grocery store after 10 minutes. She is unsure of when her symptoms began but it has been gradually worsening and now associated with a cough. She describes having a non-productive cough. No wheezing. Has not measured oxygen levels at home.  2022 - Diagnosed with ILD with associated restrictive defect on PFTs. Treated for flare in June with prednisone. CT with slight progression of GGO.  12/26/20 Overall, she feels stable. No limitations in activity. Denies shortness of breath, cough or wheezing. She had an echocardiogram for murmur. She is compliant with her CPAP nightly 6-8 hours which she reports benefit in sleep quality.   04/01/21 Since her last visit she denies shortness of breath, cough or wheezing. She compliant with CPAP nightly and reports her sleep has improved. She is taking Ozempic for her weight loss and has lost 7 lbs and noticing that she is fitting her clothing better.  10/01/21 Since our last visit, she denies SOB, cough or wheezing. Compliant with CPAP nightly and reports benefit in sleep quality. Completed PFTs and awaiting results.  Social History: Former smoker. Quit in 1972. Smoked 1/2 in her 74s beginning in college. 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 due to asbestos Husband hospitalized in 09/2020 for cardiac cath  Environmental exposures:  Radiation                                                                                                                                                      Past Medical History:  Diagnosis Date   Breast cancer ([place])    GERD (gastroesophageal reflux disease)    Hyperlipidemia    Hypothyroidism    Indigestion    S/P radiation therapy 06/14/14-07/12/14   right breast 50Gy total dose   Sleep apnea    CPAP   Syncope and collapse 2006   Wears glasses    Wears partial dentures     Allergies  Allergen Reactions   Lansoprazole     Other reaction(s): tongue burning   Latex Hives and Swelling  Omeprazole     Other reaction(s): doesn't work   Ropinirole     Other reaction(s): terrible side effects.   Semaglutide     Other reaction(s): nausea, gerd and bowel issues   Shellfish-Derived Products Nausea And Vomiting   Simvastatin-High Dose Other (See Comments)    Muscle aches, flu like symptoms.      Outpatient Medications Prior to Visit  Medication Sig Dispense Refill   acetaminophen (TYLENOL) 325 MG tablet Take 650 mg by mouth every 6 (six) hours as needed.     atorvastatin (LIPITOR) 10 MG tablet Take 10 mg by mouth daily.  1   cholecalciferol (VITAMIN D) 400 UNITS TABS Take 400 Units by mouth daily.     doxepin (SINEQUAN) 50 MG capsule Take 50 mg daily by mouth.     Levothyroxine Sodium (SYNTHROID PO) Take 150 mcg by mouth daily.      Magnesium 200 MG TABS Take 1,000 mg daily by mouth.      Multiple Vitamin (MULTIVITAMIN) tablet Take 1 tablet by mouth daily.     omeprazole (PRILOSEC) 20 MG capsule Take 1 capsule by mouth daily.     rOPINIRole (REQUIP) 0.5 MG tablet Take 0.5 mg by mouth daily.  0   telmisartan (MICARDIS) 20 MG tablet Take 20 mg by mouth daily.      temazepam (RESTORIL) 15 MG capsule Take 15 mg by  mouth at bedtime as needed for sleep.     No facility-administered medications prior to visit.    Review of Systems  Constitutional:  Negative for chills, diaphoresis, fever, malaise/fatigue and weight loss.  HENT:  Negative for congestion.   Respiratory:  Negative for cough, hemoptysis, sputum production, shortness of breath and wheezing.   Cardiovascular:  Negative for chest pain, palpitations and leg swelling.     Objective:   Vitals:   10/01/21 1519  BP: (!) 150/88  Pulse: 96  SpO2: 95%  Weight: 195 lb (88.5 kg)  Height: 5' 3"  (1.6 m)   SpO2: 95 %  Physical Exam: General: Well-appearing, no acute distress HENT: [place], AT Eyes: EOMI, no scleral icterus Respiratory: Clear to auscultation bilaterally.  No crackles, wheezing or rales Cardiovascular: RRR, -M/R/G, no JVD Extremities:-Edema,-tenderness Neuro: AAO x4, CNII-XII grossly intact Psych: Normal mood, normal affect  Data Reviewed:  Imaging: CT chest HR 02/21/2020-basilar and subpleural groundglass with minimal reticulation and single-layer honeycombing notable in the right middle lobe.  Mosaic attenuation.  Traction bronchiectasis present.  Enlarged PA suggestive of pulmonary arterial hypertension. CT Chest 10/26/20 - Slight progression of GGO with minimal reticulation and septal thickening. Possible early honeycombing  CT Chest 09/19/21 - unchanged GGO, septal thickening and reticulation, mild bronchiectasis  PFT: 06/28/20 FVC 2.15 (91%) FEV1 1.57 (90%) Ratio 86  TLC 72% DLCO 93% Interpretation: Mild restrictive defect. Normal DLCO  10/01/21 FVC 2.07 (89%) FEV1 1.78 (104%) Ratio 84  TLC Unable to obtain d/t claustrophobia DLCO 79% Interpretation: DLCO compared to 06/2020 is reduced from 93 to 79%  Sleep test 08/21/20 HST - AHI 8.7 SpO2 nadir 83%. Recommend auto CPAP 5-15 cm H20  Labs: [employer] labs Basic metabolic panel reviewed.  12/07/2019 Sodium 129, glucose 131.  Remaining values within normal  limits CBC reviewed 10/25/2019 WBC 6.35 Absolute eosinophils 100  Echocardiogram: 11/12/20 - EF 63%. Indeterminate mitral regurg, grade I DD  02/27/21-03/28/21 Days usage 30/30 days 100% >4 hours 100% Auto  5-15.  AHI 0.4     Assessment & Plan:   Discussion: 86 year  old female with OSA on CPAP, hx of right breast cancer s/p lumpectomy and radiation in remission who presents for follow-up for ILD. Last ILD flare 06/2020. Reviewed CT with stable fibrosis. PFTs with worsening DLCO from 93>79%. No significant dyspnea or O2 requirement.  Not on bronchodilators due to ineffectiveness and side effects (failed Breo).  OSA Patient uses NIV for more than four hours nightly for more then 4 hours per night on 70% of nights during the last three months of usage.  --REQUEST CPAP compliance --Counseled on sleep hygiene --Counseled on weight loss/maintenance of healthy weight --Counseled NOT to drive if/when sleepy --Advised patient to wear CPAP for at least 4 hours each night for greater than 70% of the time to avoid the machine being repossessed by insurance.  ILD, unknown etiology --Prior radiation exposure however bilateral involvement not consistent. --No evidence of pulmonary HTN on echocardiogram --Defer autoimmune work-up --Reviewed CT scan. Stable findings --PFTs with reduced DLCO --ORDER echocardiogram to rule out pulmonary hypertension. If normal, ok to follow-up --Encourage regular aerobic exercise at least three times a week  Chronic cough secondary ILD - resolved --No benefit from [facility] and had myriad of side effects (bloated, blurred vision, hot flashes and tingling feeling in legs and ear pressure). Unclear if true effects related to inhaler  Health Maintenance Immunization History  Administered Date(s) Administered   Fluad Quad(high Dose 65+) 10/29/2020   Influenza Split 11/08/2008, 11/20/2009, 11/07/2010, 02/06/2011, 11/18/2011, 05/16/2013, 10/14/2013   Influenza, High Dose  Seasonal PF 11/07/2015, 11/11/2016   Influenza, Quadrivalent, Recombinant, Inj, Pf 09/17/2017, 10/28/2018, 11/26/2019, 11/23/2020   Influenza,inj,Quad PF,6+ Mos 11/01/2012, 10/14/2013, 10/28/2014   PFIZER Comirnaty(Gray Top)Covid-19 Tri-Sucrose Vaccine 08/13/2020   PFIZER(Purple Top)SARS-COV-2 Vaccination 02/06/2019, 02/26/2019, 11/01/2019   Pneumococcal Conjugate-13 09/08/2013   Pneumococcal Polysaccharide-23 09/16/2001, 12/11/2008, 02/06/2011   Td 08/29/2002, 02/06/2011, 04/29/2012   Tdap 04/29/2012, 08/17/2012   Zoster Recombinat (Shingrix) 06/29/2017, 08/28/2017   Zoster, Live 02/25/2005, 02/06/2011, 08/28/2017   CT Lung Screen - not a candidate. Insufficient smoking history  Orders Placed This Encounter  Procedures   ECHOCARDIOGRAM COMPLETE    Standing Status:   Future    Standing Expiration Date:   10/02/2022    Scheduling Instructions:     In 6 months before Next 6 months f/up visit with Dr. Loanne Drilling.    Order Specific Question:   Where should this test be performed    Answer:   [facility]    Order Specific Question:   Perflutren DEFINITY (image enhancing agent) should be administered unless hypersensitivity or allergy exist    Answer:   Administer Perflutren    Order Specific Question:   Is a special reader required? (athlete or structural heart)    Answer:   No    Order Specific Question:   Does this study need to be read by the Structural team/Level 3 readers?    Answer:   No    Order Specific Question:   Reason for exam-Echo    Answer:   Other-Full Diagnosis List    Order Specific Question:   Full ICD-10/Reason for Exam    Answer:   Diffusion capacity of lung (dl), decreased [433295]    Order Specific Question:   Release to patient    Answer:   Immediate    Order Specific Question:   Other Comments    Answer:   Reduced DLCO. Rule out [employer] [place]   No orders of the defined types were placed in this encounter.  Return in about 6 months (around  04/01/2022).  I have spent  a total time of 32-minutes on the day of the appointment including chart review, data review, collecting history, coordinating care and discussing medical diagnosis and plan with the patient/family. Past medical history, allergies, medications were reviewed. Pertinent imaging, labs and tests included in this note have been reviewed and interpreted independently by me.  [place], MD [facility] 10/01/2021 4:01 PM  Office Number [phone]

## 2021-10-01 NOTE — Progress Notes (Signed)
Full PFT performed today. °

## 2021-10-01 NOTE — Patient Instructions (Addendum)
ILD, unknown etiology --Reviewed CT scan. Stable findings --PFTs with reduced DLCO --ORDER echocardiogram to rule out pulmonary hypertension. If normal, ok to follow-up  OSA --REQUEST CPAP compliance report --Counseled on sleep hygiene --Counseled on weight loss/maintenance of healthy weight --Counseled NOT to drive if/when sleepy --Advised patient to wear CPAP for at least 4 hours each night for greater than 70% of the time to avoid the machine being repossessed by insurance.  Follow-up with me in 6 months

## 2021-10-31 DIAGNOSIS — R0981 Nasal congestion: Secondary | ICD-10-CM | POA: Diagnosis not present

## 2021-10-31 DIAGNOSIS — R059 Cough, unspecified: Secondary | ICD-10-CM | POA: Diagnosis not present

## 2021-10-31 DIAGNOSIS — J029 Acute pharyngitis, unspecified: Secondary | ICD-10-CM | POA: Diagnosis not present

## 2021-10-31 DIAGNOSIS — I10 Essential (primary) hypertension: Secondary | ICD-10-CM | POA: Diagnosis not present

## 2021-10-31 DIAGNOSIS — Z1152 Encounter for screening for COVID-19: Secondary | ICD-10-CM | POA: Diagnosis not present

## 2021-10-31 DIAGNOSIS — G4733 Obstructive sleep apnea (adult) (pediatric): Secondary | ICD-10-CM | POA: Diagnosis not present

## 2021-10-31 DIAGNOSIS — U071 COVID-19: Secondary | ICD-10-CM | POA: Diagnosis not present

## 2021-10-31 DIAGNOSIS — R5383 Other fatigue: Secondary | ICD-10-CM | POA: Diagnosis not present

## 2021-10-31 DIAGNOSIS — K219 Gastro-esophageal reflux disease without esophagitis: Secondary | ICD-10-CM | POA: Diagnosis not present

## 2021-11-22 DIAGNOSIS — D649 Anemia, unspecified: Secondary | ICD-10-CM | POA: Diagnosis not present

## 2021-11-22 DIAGNOSIS — R7989 Other specified abnormal findings of blood chemistry: Secondary | ICD-10-CM | POA: Diagnosis not present

## 2021-11-22 DIAGNOSIS — M8580 Other specified disorders of bone density and structure, unspecified site: Secondary | ICD-10-CM | POA: Diagnosis not present

## 2021-11-22 DIAGNOSIS — E785 Hyperlipidemia, unspecified: Secondary | ICD-10-CM | POA: Diagnosis not present

## 2021-11-22 DIAGNOSIS — E039 Hypothyroidism, unspecified: Secondary | ICD-10-CM | POA: Diagnosis not present

## 2021-11-22 DIAGNOSIS — I10 Essential (primary) hypertension: Secondary | ICD-10-CM | POA: Diagnosis not present

## 2021-11-22 LAB — LAB REPORT - SCANNED: EGFR (Non-African Amer.): 68.3

## 2021-11-25 IMAGING — CT CT CHEST HIGH RESOLUTION W/O CM
2 of 7 series · 14 of 36 positions shown, 17 images · non-contrast
Comparison: None.

CLINICAL DATA: Abnormal chest radiograph, cough, breast cancer.

EXAM:
CT CHEST WITHOUT CONTRAST
TECHNIQUE: Multidetector CT imaging of the chest was performed following the
standard protocol without intravenous contrast. High resolution
imaging of the lungs, as well as inspiratory and expiratory imaging,
was performed.

[Series 4: chest 2.00 br36 s3 cor soft · coronal · 0.55mm/px · 3 of 209 slices shown]
[im 42/209  lung]
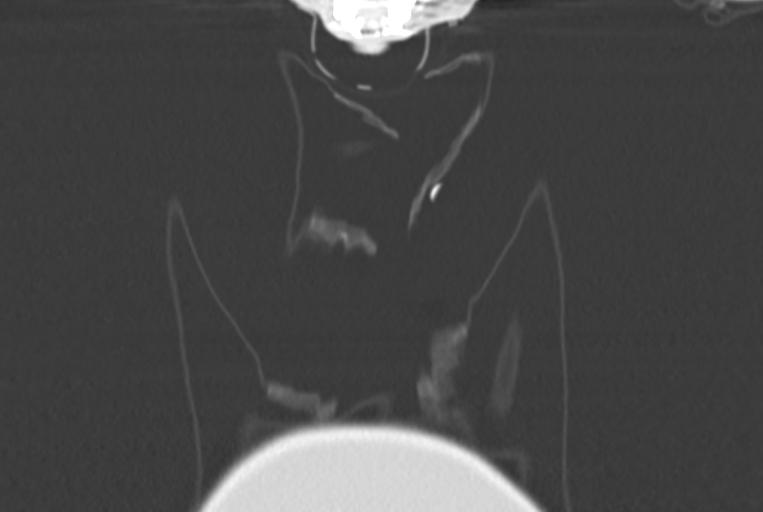
[im 84/209  lung]
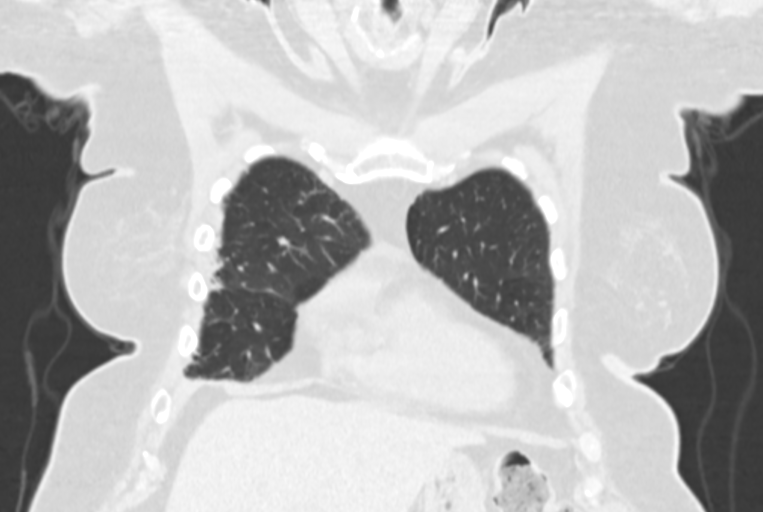
[im 125/209  lung]
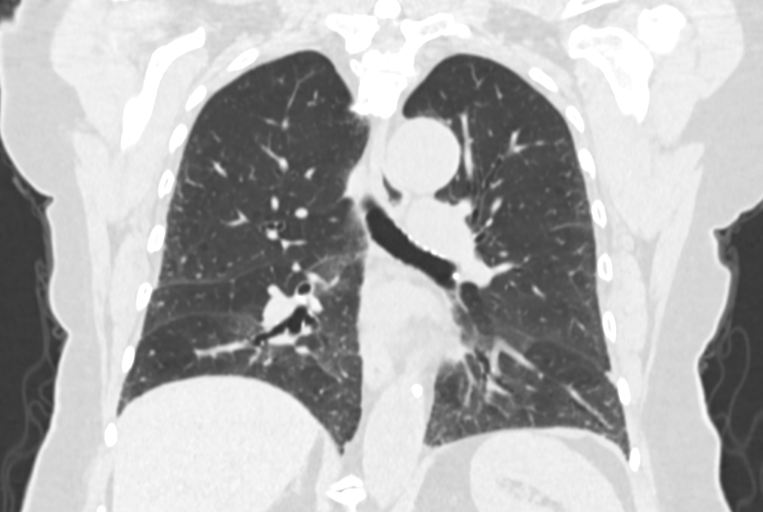

[Series 11: chest 1.00 br60 s3 high res thins 1x1 mm · axial · 0.82mm/px · z∈[+1619,+1855]mm · 11 of 280 slices shown, 14 images]
[im 22/280  mediastinal]
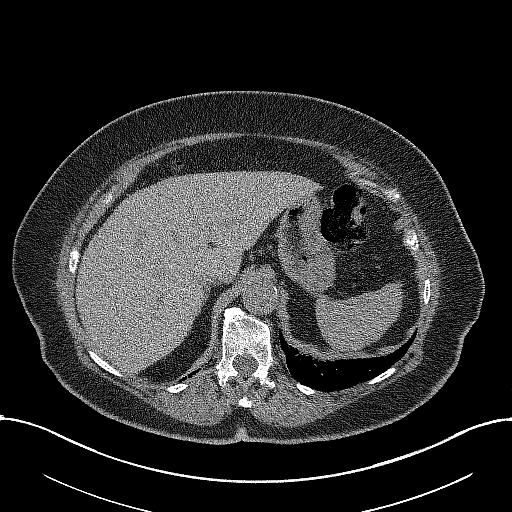
[im 22/280  lung]
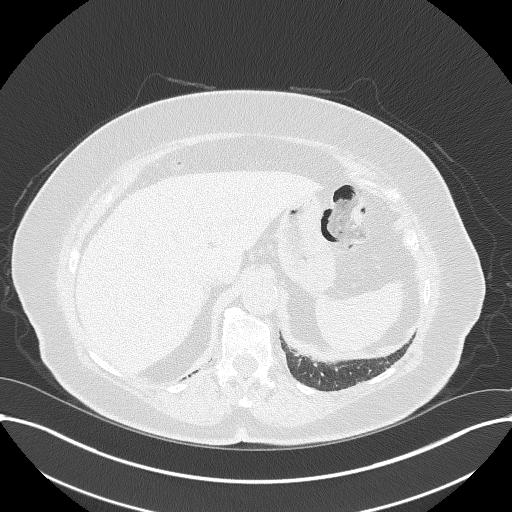
[im 43/280  lung]
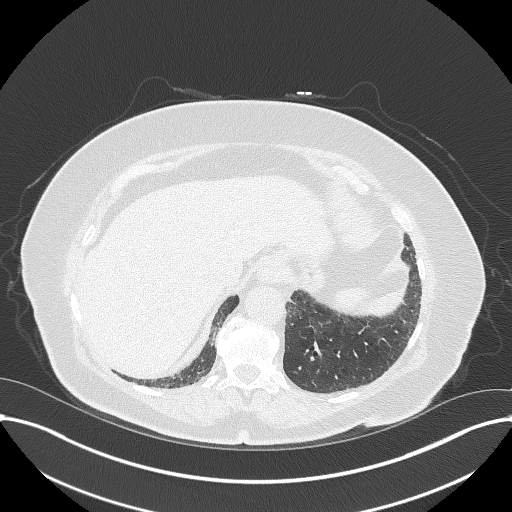
[im 65/280  lung]
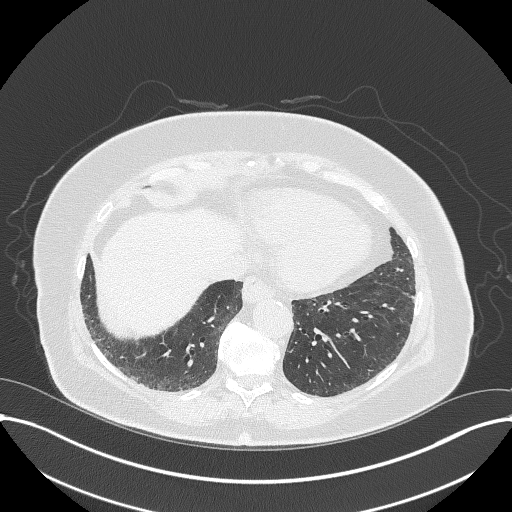
[im 86/280  lung]
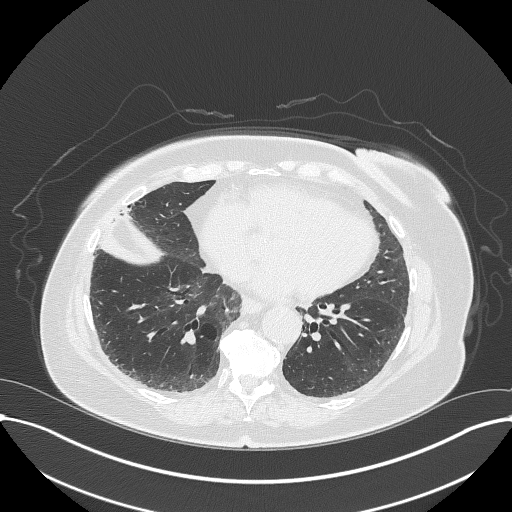
[im 108/280  mediastinal]
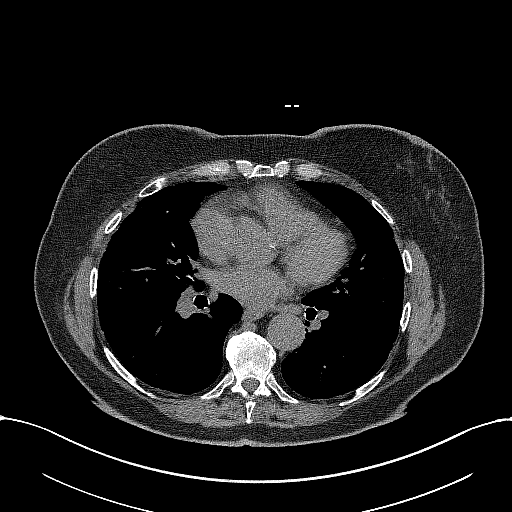
[im 108/280  lung]
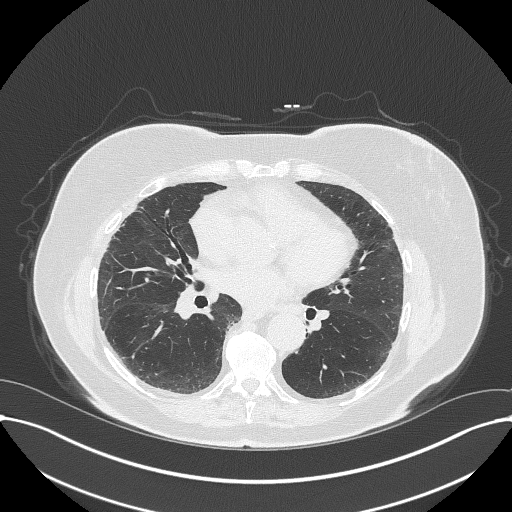
[im 151/280  lung]
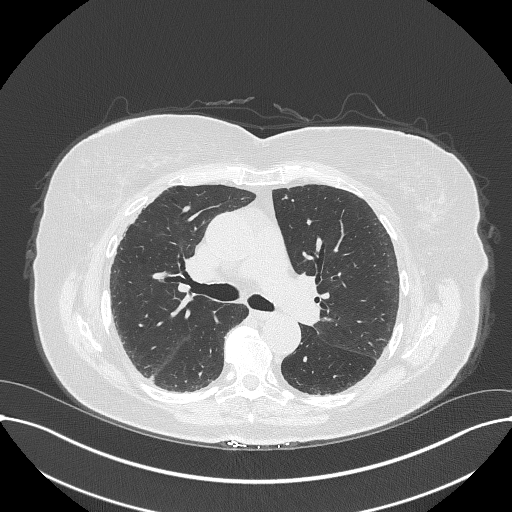
[im 172/280  lung]
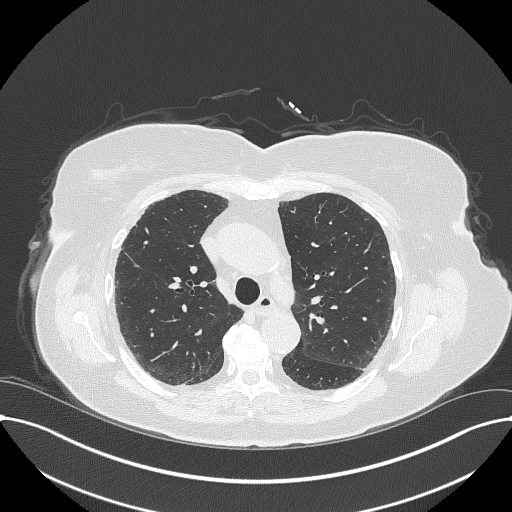
[im 194/280  lung]
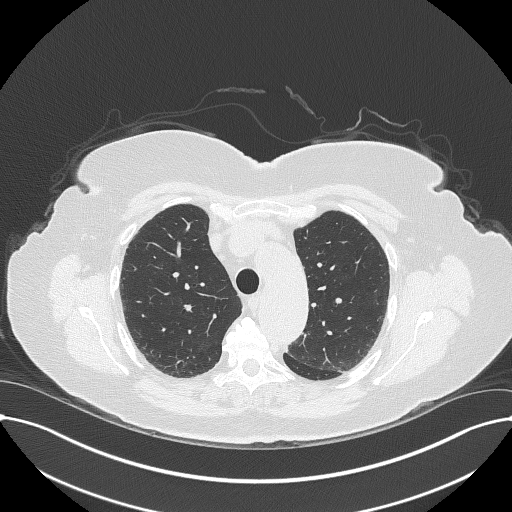
[im 215/280  mediastinal]
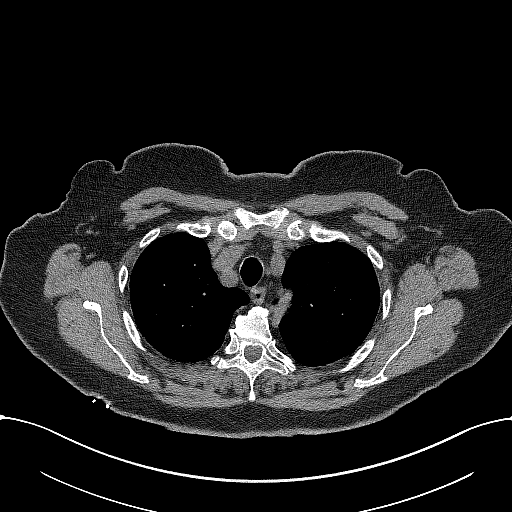
[im 215/280  lung]
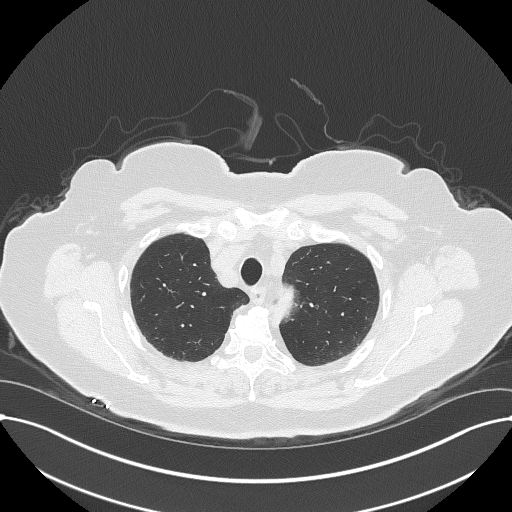
[im 237/280  lung]
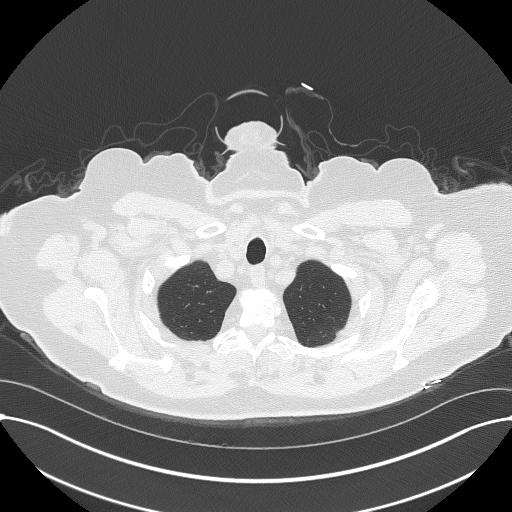
[im 258/280  lung]
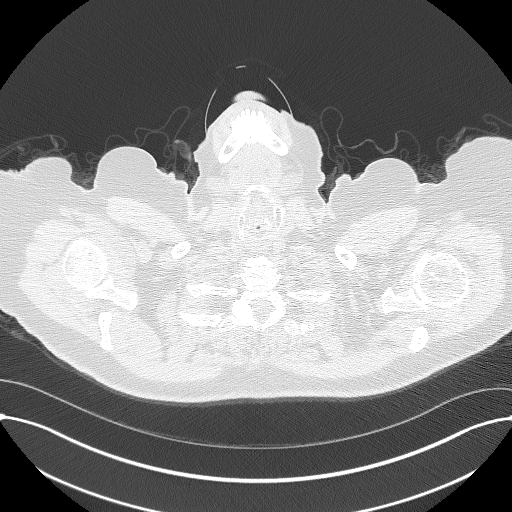

[14 of 36 positions shown; findings below may reference images not displayed]

FINDINGS: Cardiovascular: Atherosclerotic calcification of the aorta, aortic
valve and coronary arteries. Pulmonic trunk is enlarged. Heart is at
the upper limits of normal in size to mildly enlarged. No
pericardial effusion.

Mediastinum/Nodes: No pathologically enlarged mediastinal or
axillary lymph nodes. Hilar regions are difficult to definitively
evaluate without IV contrast but appear grossly unremarkable.
Esophagus is grossly unremarkable.

Lungs/Pleura: Basilar and subpleural predominant ground-glass,
scattered reticular densities and traction
bronchiectasis/bronchiolectasis. Single layer honeycombing in the
right middle lobe. Lungs are otherwise clear. No pleural fluid.
Airway is unremarkable. There is air trapping.

Upper Abdomen: Subcentimeter low-attenuation lesion in the right
hepatic lobe is too small to characterize. Visualized portions of
the liver, adrenal glands, spleen and stomach are otherwise
unremarkable.

Musculoskeletal: Degenerative changes in the spine. Flowing anterior
osteophytosis in the thoracic spine.
IMPRESSION: 1. Basilar and subpleural predominant ground-glass, minimal
reticulation, traction bronchiectasis/bronchiolectasis and
honeycombing with air trapping. Findings support fibrotic
nonspecific interstitial pneumonitis. Usual interstitial pneumonitis
is not excluded. Findings are indeterminate for UIP per consensus
guidelines: Diagnosis of Idiopathic Pulmonary Fibrosis: An Official
ATS/ERS/JRS/ALAT Clinical Practice Guideline. Am J Respir Crit Care
Med Vol 198, Bambucafe 5, [id number][DATE].
2. Aortic atherosclerosis ([id number]). Coronary artery
calcification.
3. Enlarged pulmonic trunk, indicative of pulmonary arterial
hypertension.

## 2021-11-29 DIAGNOSIS — H6122 Impacted cerumen, left ear: Secondary | ICD-10-CM | POA: Diagnosis not present

## 2021-11-29 DIAGNOSIS — M8580 Other specified disorders of bone density and structure, unspecified site: Secondary | ICD-10-CM | POA: Diagnosis not present

## 2021-11-29 DIAGNOSIS — J849 Interstitial pulmonary disease, unspecified: Secondary | ICD-10-CM | POA: Diagnosis not present

## 2021-11-29 DIAGNOSIS — R7301 Impaired fasting glucose: Secondary | ICD-10-CM | POA: Diagnosis not present

## 2021-11-29 DIAGNOSIS — Z23 Encounter for immunization: Secondary | ICD-10-CM | POA: Diagnosis not present

## 2021-11-29 DIAGNOSIS — R5383 Other fatigue: Secondary | ICD-10-CM | POA: Diagnosis not present

## 2021-11-29 DIAGNOSIS — G5600 Carpal tunnel syndrome, unspecified upper limb: Secondary | ICD-10-CM | POA: Diagnosis not present

## 2021-11-29 DIAGNOSIS — Z1331 Encounter for screening for depression: Secondary | ICD-10-CM | POA: Diagnosis not present

## 2021-11-29 DIAGNOSIS — M1612 Unilateral primary osteoarthritis, left hip: Secondary | ICD-10-CM | POA: Diagnosis not present

## 2021-11-29 DIAGNOSIS — Z0000 Encounter for general adult medical examination without abnormal findings: Secondary | ICD-10-CM | POA: Diagnosis not present

## 2021-11-29 DIAGNOSIS — I10 Essential (primary) hypertension: Secondary | ICD-10-CM | POA: Diagnosis not present

## 2021-11-29 DIAGNOSIS — R82998 Other abnormal findings in urine: Secondary | ICD-10-CM | POA: Diagnosis not present

## 2021-11-29 DIAGNOSIS — Z1339 Encounter for screening examination for other mental health and behavioral disorders: Secondary | ICD-10-CM | POA: Diagnosis not present

## 2021-11-29 DIAGNOSIS — I340 Nonrheumatic mitral (valve) insufficiency: Secondary | ICD-10-CM | POA: Diagnosis not present

## 2021-12-24 ENCOUNTER — Ambulatory Visit: Payer: Medicare PPO | Admitting: Podiatry

## 2021-12-24 DIAGNOSIS — M79675 Pain in left toe(s): Secondary | ICD-10-CM | POA: Diagnosis not present

## 2021-12-24 DIAGNOSIS — M79672 Pain in left foot: Secondary | ICD-10-CM | POA: Diagnosis not present

## 2021-12-24 DIAGNOSIS — L84 Corns and callosities: Secondary | ICD-10-CM

## 2021-12-24 DIAGNOSIS — M79674 Pain in right toe(s): Secondary | ICD-10-CM

## 2021-12-24 DIAGNOSIS — M79671 Pain in right foot: Secondary | ICD-10-CM | POA: Diagnosis not present

## 2021-12-24 DIAGNOSIS — B351 Tinea unguium: Secondary | ICD-10-CM | POA: Diagnosis not present

## 2021-12-24 NOTE — Progress Notes (Signed)
  Subjective:  Patient ID: Sydney Wood, female    DOB: [birth year],  MRN: [id number]  Sydney Wood presents to clinic today for callus(es) b/l lower extremities and painful thick toenails that are difficult to trim. Painful toenails interfere with ambulation. Aggravating factors include wearing enclosed shoe gear. Pain is relieved with periodic professional debridement. Painful calluses are aggravated when weightbearing with and without shoegear. Pain is relieved with periodic professional debridement.  Chief Complaint  Patient presents with   Nail Problem    Routine foot care PCP-Mark Perini PCP VST-11/2021   New problem(s): None.   PCP is Crist Infante, MD.  Allergies  Allergen Reactions   Lansoprazole     Other reaction(s): tongue burning   Latex Hives and Swelling   Omeprazole     Other reaction(s): doesn't work   Ropinirole     Other reaction(s): terrible side effects.   Semaglutide     Other reaction(s): nausea, gerd and bowel issues   Shellfish-Derived Products Nausea And Vomiting   Simvastatin-High Dose Other (See Comments)    Muscle aches, flu like symptoms.     Review of Systems: Negative except as noted in the HPI.  Objective:  Sydney Wood is a pleasant 86 y.o. female in NAD. AAO x 3.   Constitutional Sydney Wood is a pleasant 86 y.o.  female, WD, WN in NAD. AAO x 3.   Vascular Capillary refill time to digits immediate b/l. Palpable DP pulse(s) b/l LE. Palpable PT pulse(s) b/l LE. Pedal hair absent. No pain with calf compression b/l. Lower extremity skin temperature gradient within normal limits. No edema noted b/l LE. No cyanosis or clubbing noted b/l LE.  Neurologic Normal speech. Oriented to person, place, and time.   Dermatologic Pedal integument with normal turgor, texture and tone b/l LE. No open wounds b/l. No interdigital macerations b/l. Toenails 1-5 b/l elongated, thickened, discolored with subungual debris. +Tenderness with dorsal  palpation of nailplates.   Hyperkeratotic lesion(s) submet head 1 b/l.  No erythema, no edema, no drainage, no fluctuance.  Orthopedic: Muscle strength 5/5 to all lower extremity muscle groups bilaterally. No pain, crepitus or joint limitation noted with ROM bilateral LE. Hammertoe(s) noted to the bilateral 5th toes.   Radiographs: None  Assessment/Plan: 1. Pain due to onychomycosis of toenails of both feet   2. Callus   3. Foot pain, bilateral     No orders of the defined types were placed in this encounter.   -Consent given for treatment as described below: -Examined patient. -Continue supportive shoe gear daily. -Toenails 1-5 b/l were debrided in length and girth with sterile nail nippers and dremel without iatrogenic bleeding.  -Callus(es) submet head 1 b/l pared utilizing sterile scalpel blade without complication or incident. Total number debrided =2. -Patient/POA to call should there be question/concern in the interim.   Return in about 3 months (around 03/26/2022).  Marzetta Board, DPM

## 2021-12-27 ENCOUNTER — Encounter: Payer: Self-pay | Admitting: Podiatry

## 2022-01-02 DIAGNOSIS — H3540 Unspecified peripheral retinal degeneration: Secondary | ICD-10-CM | POA: Diagnosis not present

## 2022-01-02 DIAGNOSIS — H25813 Combined forms of age-related cataract, bilateral: Secondary | ICD-10-CM | POA: Diagnosis not present

## 2022-01-02 DIAGNOSIS — I10 Essential (primary) hypertension: Secondary | ICD-10-CM | POA: Diagnosis not present

## 2022-01-02 DIAGNOSIS — H5203 Hypermetropia, bilateral: Secondary | ICD-10-CM | POA: Diagnosis not present

## 2022-01-02 DIAGNOSIS — H04123 Dry eye syndrome of bilateral lacrimal glands: Secondary | ICD-10-CM | POA: Diagnosis not present

## 2022-01-02 DIAGNOSIS — H35433 Paving stone degeneration of retina, bilateral: Secondary | ICD-10-CM | POA: Diagnosis not present

## 2022-01-02 DIAGNOSIS — H35033 Hypertensive retinopathy, bilateral: Secondary | ICD-10-CM | POA: Diagnosis not present

## 2022-01-02 DIAGNOSIS — H53143 Visual discomfort, bilateral: Secondary | ICD-10-CM | POA: Diagnosis not present

## 2022-01-02 DIAGNOSIS — H52223 Regular astigmatism, bilateral: Secondary | ICD-10-CM | POA: Diagnosis not present

## 2022-02-10 DIAGNOSIS — L814 Other melanin hyperpigmentation: Secondary | ICD-10-CM | POA: Diagnosis not present

## 2022-02-10 DIAGNOSIS — L738 Other specified follicular disorders: Secondary | ICD-10-CM | POA: Diagnosis not present

## 2022-02-10 DIAGNOSIS — D0439 Carcinoma in situ of skin of other parts of face: Secondary | ICD-10-CM | POA: Diagnosis not present

## 2022-02-10 DIAGNOSIS — L570 Actinic keratosis: Secondary | ICD-10-CM | POA: Diagnosis not present

## 2022-02-10 DIAGNOSIS — L820 Inflamed seborrheic keratosis: Secondary | ICD-10-CM | POA: Diagnosis not present

## 2022-02-14 DIAGNOSIS — G4733 Obstructive sleep apnea (adult) (pediatric): Secondary | ICD-10-CM | POA: Diagnosis not present

## 2022-02-18 DIAGNOSIS — E039 Hypothyroidism, unspecified: Secondary | ICD-10-CM | POA: Diagnosis not present

## 2022-02-18 DIAGNOSIS — D0439 Carcinoma in situ of skin of other parts of face: Secondary | ICD-10-CM | POA: Diagnosis not present

## 2022-02-18 DIAGNOSIS — Z23 Encounter for immunization: Secondary | ICD-10-CM | POA: Diagnosis not present

## 2022-02-18 DIAGNOSIS — E785 Hyperlipidemia, unspecified: Secondary | ICD-10-CM | POA: Diagnosis not present

## 2022-02-21 DIAGNOSIS — H6121 Impacted cerumen, right ear: Secondary | ICD-10-CM | POA: Diagnosis not present

## 2022-02-21 DIAGNOSIS — H6993 Unspecified Eustachian tube disorder, bilateral: Secondary | ICD-10-CM | POA: Diagnosis not present

## 2022-02-21 DIAGNOSIS — H903 Sensorineural hearing loss, bilateral: Secondary | ICD-10-CM | POA: Diagnosis not present

## 2022-03-31 ENCOUNTER — Encounter: Payer: Self-pay | Admitting: Podiatry

## 2022-03-31 ENCOUNTER — Ambulatory Visit: Payer: Medicare PPO | Admitting: Podiatry

## 2022-03-31 DIAGNOSIS — B351 Tinea unguium: Secondary | ICD-10-CM | POA: Diagnosis not present

## 2022-03-31 DIAGNOSIS — M79674 Pain in right toe(s): Secondary | ICD-10-CM

## 2022-03-31 DIAGNOSIS — M79675 Pain in left toe(s): Secondary | ICD-10-CM

## 2022-03-31 NOTE — Progress Notes (Signed)
  Subjective:  Patient ID: Sydney Wood, female    DOB: 1935/08/08,  MRN: [id number]  Sydney Wood presents to clinic today for painful thick toenails that are difficult to trim. Pain interferes with ambulation. Aggravating factors include wearing enclosed shoe gear. Pain is relieved with periodic professional debridement.  Chief Complaint  Patient presents with   Nail Problem    [facility] PCP-Perini PCP VST-10/2021   New problem(s): None.   She and her husband will be going to [place] to visit their daughter for their daughter's birthday.  PCP is Crist Infante, MD.  Allergies  Allergen Reactions   Lansoprazole     Other reaction(s): tongue burning   Latex Hives and Swelling   Omeprazole     Other reaction(s): doesn't work   Ropinirole     Other reaction(s): terrible side effects.   Semaglutide     Other reaction(s): nausea, gerd and bowel issues   Shellfish-Derived Products Nausea And Vomiting   Simvastatin-High Dose Other (See Comments)    Muscle aches, flu like symptoms.     Review of Systems: Negative except as noted in the HPI.  Objective: No changes noted in today's physical examination. There were no vitals filed for this visit. Sydney Wood is a pleasant 87 y.o. female WD, WN in NAD. AAO x 3. Vascular Capillary refill time to digits immediate b/l. Palpable DP pulse(s) b/l LE. Palpable PT pulse(s) b/l LE. Pedal hair absent. No pain with calf compression b/l. Lower extremity skin temperature gradient within normal limits. No edema noted b/l LE. No cyanosis or clubbing noted b/l LE.  Neurologic Normal speech. Oriented to person, place, and time.   Dermatologic Pedal integument with normal turgor, texture and tone b/l LE. No open wounds b/l. No interdigital macerations b/l. Toenails 1-5 b/l elongated, thickened, discolored with subungual debris. +Tenderness with dorsal palpation of nailplates.   Hyperkeratotic lesion(s) submet head 1 b/l.  No erythema, no edema,  no drainage, no fluctuance.  Orthopedic: Muscle strength 5/5 to all lower extremity muscle groups bilaterally. No pain, crepitus or joint limitation noted with ROM bilateral LE. Hammertoe(s) noted to the bilateral 5th toes.   Radiographs: None  Assessment/Plan: 1. Pain due to onychomycosis of toenails of both feet     No orders of the defined types were placed in this encounter.  -Patient was evaluated and treated. All patient's and/or POA's questions/concerns answered on today's visit. -Patient declined callus debridement. We discussed using Urea 20% cream on calluses only once daily and weekly filing with emery board. -Mycotic toenails 1-5 bilaterally were debrided in length and girth with sterile nail nippers and dremel without incident. -Patient/POA to call should there be question/concern in the interim.   Return in about 3 months (around 07/01/2022).  Marzetta Board, DPM

## 2022-04-02 ENCOUNTER — Ambulatory Visit (INDEPENDENT_AMBULATORY_CARE_PROVIDER_SITE_OTHER): Payer: Medicare PPO

## 2022-04-02 DIAGNOSIS — R942 Abnormal results of pulmonary function studies: Secondary | ICD-10-CM

## 2022-04-02 DIAGNOSIS — R0609 Other forms of dyspnea: Secondary | ICD-10-CM | POA: Diagnosis not present

## 2022-04-02 LAB — ECHOCARDIOGRAM COMPLETE
AR max vel: 1.46 cm2
AV Area VTI: 1.43 cm2
AV Area mean vel: 1.29 cm2
AV Mean grad: 7 mmHg
AV Peak grad: 13.2 mmHg
Ao pk vel: 1.82 m/s
Area-P 1/2: 2.16 cm2
S' Lateral: 2.12 cm

## 2022-04-15 ENCOUNTER — Encounter (HOSPITAL_BASED_OUTPATIENT_CLINIC_OR_DEPARTMENT_OTHER): Payer: Self-pay | Admitting: Pulmonary Disease

## 2022-04-15 ENCOUNTER — Ambulatory Visit (HOSPITAL_BASED_OUTPATIENT_CLINIC_OR_DEPARTMENT_OTHER): Payer: Medicare PPO | Admitting: Pulmonary Disease

## 2022-04-15 VITALS — BP 148/70 | HR 81 | Ht 63.0 in | Wt 195.0 lb

## 2022-04-15 DIAGNOSIS — G4733 Obstructive sleep apnea (adult) (pediatric): Secondary | ICD-10-CM | POA: Diagnosis not present

## 2022-04-15 NOTE — Patient Instructions (Signed)
Mild OSA Patient uses NIV for more than four hours nightly for at least 70% of nights during the last three months of usage. The patient has been using and benefiting from PAP use and will continue to benefit from therapy.  --Counseled on sleep hygiene --Counseled on weight loss/maintenance of healthy weight. Discuss with PCP regarding alternatives to Ozempic --Counseled NOT to drive if/when sleepy  ILD, unknown etiology --Prior radiation exposure however bilateral involvement not consistent. --PFTs with reduced DLCO however overall normal echocardiogram with no evidence of pulmonary hypertension --Defer autoimmune work-up --Encourage regular aerobic exercise at least three times a week

## 2022-04-15 NOTE — Progress Notes (Signed)
Subjective:   PATIENT ID: Sydney Wood GENDER: female DOB: September 05, 1935, MRN: [id number]   HPI  Chief Complaint  Patient presents with   Follow-up    Feeling fine cpap is doing well   Reason for Visit: Follow-up   [name] is an 87 year old female with OSA on CPAP, history of right breast cancer s/p lumpectomy and radiation in remission, hypertension, hyperlipidemia, hypothyroidism, reflux who presents for follow-up.  Synopsis: Initially referred by PCP Dr. Joylene Draft on 01/30/2020 for abnormal CT chest demonstrating extra mucus in the airways and scarring. She reports long standing history of shortness of breath with exertion with short-medium distances. She gets out of breath after walking a flight of stairs. She will need to rest after walking in the grocery store after 10 minutes. She is unsure of when her symptoms began but it has been gradually worsening and now associated with a cough. She describes having a non-productive cough. No wheezing. Has not measured oxygen levels at home.  2022 - Diagnosed with ILD with associated restrictive defect on PFTs. Treated for flare in June with prednisone. CT with slight progression of GGO.  12/26/20 Overall, she feels stable. No limitations in activity. Denies shortness of breath, cough or wheezing. She had an echocardiogram for murmur. She is compliant with her CPAP nightly 6-8 hours which she reports benefit in sleep quality.   04/01/21 Since her last visit she denies shortness of breath, cough or wheezing. She compliant with CPAP nightly and reports her sleep has improved. She is taking Ozempic for her weight loss and has lost 7 lbs and noticing that she is fitting her clothing better.  10/01/21 Since our last visit, she denies SOB, cough or wheezing. Compliant with CPAP nightly and reports benefit in sleep quality. Completed PFTs and awaiting results.  04/15/22 Since our last visit she reports she is overall doing well. She is  compliant with CPAP nightly. Sometimes exercises by walking 1-2 times a week. Denies shortness of breath, cough and wheezing. Planning for [employer] for her 87th birthday this summer.  Social History: Former smoker. Quit in 1972. Smoked 1/2 in her 58s beginning in college. 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 Had to evacuate one room due to asbestos Husband hospitalized in 09/2020 for cardiac cath  Environmental exposures:  Radiation                                                                                                                                                      Past Medical History:  Diagnosis Date   Breast cancer ([place])    GERD (gastroesophageal reflux disease)    Hyperlipidemia    Hypothyroidism    Indigestion    S/P radiation therapy 06/14/14-07/12/14   right breast 50Gy total dose  Sleep apnea    CPAP   Syncope and collapse 2006   Wears glasses    Wears partial dentures     Allergies  Allergen Reactions   Lansoprazole     Other reaction(s): tongue burning   Latex Hives and Swelling   Omeprazole     Other reaction(s): doesn't work   Ropinirole     Other reaction(s): terrible side effects.   Semaglutide     Other reaction(s): nausea, gerd and bowel issues   Shellfish-Derived Products Nausea And Vomiting   Simvastatin-High Dose Other (See Comments)    Muscle aches, flu like symptoms.      Outpatient Medications Prior to Visit  Medication Sig Dispense Refill   acetaminophen (TYLENOL) 325 MG tablet Take 650 mg by mouth every 6 (six) hours as needed.     atorvastatin (LIPITOR) 10 MG tablet Take 10 mg by mouth daily.  1   cholecalciferol (VITAMIN D) 400 UNITS TABS Take 400 Units by mouth daily.     doxepin (SINEQUAN) 50 MG capsule Take 50 mg daily by mouth.     Levothyroxine Sodium (SYNTHROID PO) Take 150 mcg by mouth daily.      Magnesium 200 MG TABS Take 1,000 mg daily by mouth.      Multiple Vitamin (MULTIVITAMIN)  tablet Take 1 tablet by mouth daily.     omeprazole (PRILOSEC) 20 MG capsule Take 1 capsule by mouth daily.     rOPINIRole (REQUIP) 0.5 MG tablet Take 0.5 mg by mouth daily.  0   telmisartan (MICARDIS) 40 MG tablet Take 40 mg by mouth daily.     temazepam (RESTORIL) 15 MG capsule Take 15 mg by mouth at bedtime as needed for sleep.     No facility-administered medications prior to visit.    Review of Systems  Constitutional:  Negative for chills, diaphoresis, fever, malaise/fatigue and weight loss.  HENT:  Negative for congestion.   Respiratory:  Negative for cough, hemoptysis, sputum production, shortness of breath and wheezing.   Cardiovascular:  Negative for chest pain, palpitations and leg swelling.     Objective:   Vitals:   04/15/22 1525  BP: (!) 148/70  Pulse: 81  SpO2: 95%  Weight: 195 lb (88.5 kg)  Height: 5\' 3"  (1.6 m)   SpO2: 95 % O2 Device: None (Room air)  Physical Exam: General: Well-appearing, no acute distress HENT: [place], AT Eyes: EOMI, no scleral icterus Respiratory: Clear to auscultation bilaterally.  No crackles, wheezing or rales Cardiovascular: RRR, -M/R/G, no JVD Extremities:-Edema,-tenderness Neuro: AAO x4, CNII-XII grossly intact Psych: Normal mood, normal affect  Data Reviewed:  Imaging: CT chest HR 02/21/2020-basilar and subpleural groundglass with minimal reticulation and single-layer honeycombing notable in the right middle lobe.  Mosaic attenuation.  Traction bronchiectasis present.  Enlarged PA suggestive of pulmonary arterial hypertension. CT Chest 10/26/20 - Slight progression of GGO with minimal reticulation and septal thickening. Possible early honeycombing  CT Chest 09/19/21 - unchanged GGO, septal thickening and reticulation, mild bronchiectasis  PFT: 06/28/20 FVC 2.15 (91%) FEV1 1.57 (90%) Ratio 86  TLC 72% DLCO 93% Interpretation: Mild restrictive defect. Normal DLCO  10/01/21 FVC 2.07 (89%) FEV1 1.78 (104%) Ratio 84  TLC Unable to  obtain d/t claustrophobia DLCO 79% Interpretation: DLCO compared to 06/2020 is reduced from 93 to 79%  Sleep test 08/21/20 HST - AHI 8.7 SpO2 nadir 83%. Recommend auto CPAP 5-15 cm H20  Labs: [employer] labs Basic metabolic panel reviewed.  12/07/2019 Sodium 129, glucose 131.  Remaining values within normal limits CBC reviewed 10/25/2019 WBC 6.35 Absolute eosinophils 100  Echocardiogram: 11/12/20 - EF 63%. Indeterminate mitral regurg, grade I DD  02/14/22-04/14/22 Days usage 60/60 days 100% >4 hours 100% Auto PAP 5-15 cm H20 AHI 0.3    Assessment & Plan:   Discussion: 87 year old female with OSA on CPAP, hx of right breast cancer s/p lumpectomy and radiation in remission who presents for follow-up for ILD. Asymptomatic with well controlled OSA on current settings. Not on bronchodilators due to ineffectiveness and side effects (failed Breo).  Mild OSA Patient uses NIV for more than four hours nightly for at least 70% of nights during the last three months of usage. The patient has been using and benefiting from PAP use and will continue to benefit from therapy.  --Counseled on sleep hygiene --Counseled on weight loss/maintenance of healthy weight. Discuss with PCP regarding alternatives to Ozempic --Counseled NOT to drive if/when sleepy  ILD, unknown etiology --Prior radiation exposure however bilateral involvement not consistent. --PFTs with reduced DLCO however overall normal echocardiogram with no evidence of pulmonary hypertension --Defer autoimmune work-up --Encourage regular aerobic exercise at least three times a week --Repeat ambulatory O2 at next visit for routine check --Plan for repeat PFTs end of the year/beginning of new year  Chronic cough secondary ILD - resolved --No benefit from [facility] and had myriad of side effects (bloated, blurred vision, hot flashes and tingling feeling in legs and ear pressure). Unclear if true effects related to  inhaler  Health Maintenance Immunization History  Administered Date(s) Administered   Fluad Quad(high Dose 65+) 10/29/2020   Influenza Split 11/08/2008, 11/20/2009, 11/07/2010, 02/06/2011, 11/18/2011, 05/16/2013, 10/14/2013   Influenza, High Dose Seasonal PF 11/07/2015, 11/11/2016   Influenza, Quadrivalent, Recombinant, Inj, Pf 09/17/2017, 10/28/2018, 11/26/2019, 11/23/2020   Influenza,inj,Quad PF,6+ Mos 11/01/2012, 10/14/2013, 10/28/2014   PFIZER Comirnaty(Gray Top)Covid-19 Tri-Sucrose Vaccine 08/13/2020   PFIZER(Purple Top)SARS-COV-2 Vaccination 02/06/2019, 02/26/2019, 11/01/2019   Pneumococcal Conjugate-13 09/08/2013   Pneumococcal Polysaccharide-23 09/16/2001, 12/11/2008, 02/06/2011   Td 08/29/2002, 02/06/2011, 04/29/2012   Tdap 04/29/2012, 08/17/2012   Zoster Recombinat (Shingrix) 06/29/2017, 08/28/2017   Zoster, Live 02/25/2005, 02/06/2011, 08/28/2017   CT Lung Screen - not a candidate. Insufficient smoking history  No orders of the defined types were placed in this encounter.  No orders of the defined types were placed in this encounter.  Return in about 6 months (around 10/16/2022) for routine follow-up, with Dr. Loanne Drilling.  I have spent a total time of 36-minutes on the day of the appointment including chart review, data review, collecting history, coordinating care and discussing medical diagnosis and plan with the patient/family. Past medical history, allergies, medications were reviewed. Pertinent imaging, labs and tests included in this note have been reviewed and interpreted independently by me.  [place], MD [facility] 04/15/2022 9:36 PM  Office Number [phone]

## 2022-05-26 DIAGNOSIS — G4733 Obstructive sleep apnea (adult) (pediatric): Secondary | ICD-10-CM | POA: Diagnosis not present

## 2022-06-02 ENCOUNTER — Encounter: Payer: Self-pay | Admitting: Cardiovascular Disease

## 2022-06-02 DIAGNOSIS — L821 Other seborrheic keratosis: Secondary | ICD-10-CM | POA: Diagnosis not present

## 2022-06-02 DIAGNOSIS — L820 Inflamed seborrheic keratosis: Secondary | ICD-10-CM | POA: Diagnosis not present

## 2022-06-02 DIAGNOSIS — L814 Other melanin hyperpigmentation: Secondary | ICD-10-CM | POA: Diagnosis not present

## 2022-06-02 DIAGNOSIS — L720 Epidermal cyst: Secondary | ICD-10-CM | POA: Diagnosis not present

## 2022-06-02 DIAGNOSIS — L738 Other specified follicular disorders: Secondary | ICD-10-CM | POA: Diagnosis not present

## 2022-06-02 DIAGNOSIS — Z85828 Personal history of other malignant neoplasm of skin: Secondary | ICD-10-CM | POA: Diagnosis not present

## 2022-06-02 DIAGNOSIS — L218 Other seborrheic dermatitis: Secondary | ICD-10-CM | POA: Diagnosis not present

## 2022-06-02 NOTE — Progress Notes (Unsigned)
[name] Date of Birth  1935/10/11 [facility] [address]    [address] [place], [place]  [address]    Problem List 1. Hypertension 2. Hyperlipidemia 3. Sleep apnea 4. Breast Cancer    87 yo with a hx of presyncope, hyperlipidemia, sleep apnea.  She has been under a lot of stress recently. Her sister was diagnosed with stage III inflammatory breast cancer. She has been going to [facility] a regular basis to help her sister with chemotherapy and other related issues.  She denies any chest pain, shortness breath, syncope, or presyncope.  She has a history of hypercholesterolemia. Her cholesterol levels are followed by Dr. Waynard Edwards.  She brought her blood pressure log with her today. All of her readings are acceptable.  She brought labs were drawn at Dr. Mauricio Po his office. All of her numbers are quite good. Her a protein B. level is 67 which is quite good. Her total cholesterol is 150. The triglyceride level is 129. The HDL is 42. Her LDL is 82.  She's not had any episodes of chest pain or shortness breath.  Nov. 17, 2014: Feeling well.  BP at home is usually normal.  Still traveling lots - helping her sister with breast cancer.  Exercising a bit. tries to walk in the neighborhood.   Nov. 24, 2015:  Alverna is doing well.  Tries to exercise.  BP readings are ok No CP or dyspnea.   Nov. 28, 2016:    Has been diagnosed with right breast breast cancer ( DCIS, stage 0)  Had lumpectomy and 20 XRT treatments. Has had left shoulder problems  Was also found to have Meralgia Paresthetica  - affects her sciatic nerve and left leg pain and tingling .  Is now getting back some energy   April 22, 2016:  Doing well.   BP has been a little elevated   Feeling well  Labs from [facility].  Her total cholesterol is 133.  The triglyceride level is 94.  HDL level is 41.  The LDL is 73.  Nov. 15, 2018:    Doing ok We started HTCZ last visit.    Thinks her vision is not as good ,  More blurred vision Walks on occasion  July 21, 2017: Doing well . Taking her BP at home  -  BP is well controlled at home  No CP or dyspnea.  Lipids managed by Dr. Waynard Edwards  Is on [facility] - tolerating it well   Oct. 1, 2020   Aries is seen today for follow-up of her hypertension and hyperlipidemia.  Recently saw Dr. Waynard Edwards,  BP was up so he increased the Telmasartan  Feeling well,  Not exercising much Her TSH was low so her synthroid dose was reduced.  ( which may explain her HR being a bit high)   Oct. 4, 2021: Brynleigh is seen today for follow up of her HTN and HLD  BP is normal at home  No CP or dyspnea Has some DOE,  Dose not exercise .   She tries to walk on occasion  Sees Dr. Waynard Edwards for her lipid management.   Oct. 3, 2022 Iysha is seen today for follow up of her HTN and HLD  Has developed Idopathic pulmonary fibrosis Possibly caused by radiation from her breast radiation Is seeing pulmonary  .  Dr. Everardo All may want a right heart cath if her symptoms worsen.  Labs from Dr. Laurey Morale office from September,  2021 show a total cholesterol of 140, triglyceride level is 126, HDL is 43, LDL is 72. Sodium level is 129.  Her HCTZ was discontinued following this lab result.  Potassium is normal.  Liver functions are normal.  Creatinine 0.09 TSH was 0.16 which is normal  May 31, 2021: Lynice is seen today for follow-up of her hypertension, hyperlipidemia.  She also has a history of idiopathic pulmonary fibrosis, possibly caused by radiation from her right breast cancer. Her pulmonologist has considered may be a right heart catheterization if her symptoms worsen. She has hyperlipidemia which has been managed by Dr. Laurey Morale office.   Breathing seems to be stable   No Cp ,  Chronic dyspnea   Echo from Oct. 2022 shows normal LV function , mild - moderate MR .   She did not tolerate Ozempic.  It caused too many side effects.   Jun 03, 2022  Macey is seen for follow up of her HTN, HLD , idopathic pulmonary fibrosis  Current Outpatient Medications on File Prior to Visit  Medication Sig Dispense Refill   acetaminophen (TYLENOL) 325 MG tablet Take 650 mg by mouth every 6 (six) hours as needed.     atorvastatin (LIPITOR) 10 MG tablet Take 10 mg by mouth daily.  1   cholecalciferol (VITAMIN D) 400 UNITS TABS Take 400 Units by mouth daily.     doxepin (SINEQUAN) 50 MG capsule Take 50 mg daily by mouth.     Levothyroxine Sodium (SYNTHROID PO) Take 150 mcg by mouth daily.      Magnesium 200 MG TABS Take 1,000 mg daily by mouth.      Multiple Vitamin (MULTIVITAMIN) tablet Take 1 tablet by mouth daily.     omeprazole (PRILOSEC) 20 MG capsule Take 1 capsule by mouth daily.     rOPINIRole (REQUIP) 0.5 MG tablet Take 0.5 mg by mouth daily.  0   telmisartan (MICARDIS) 40 MG tablet Take 40 mg by mouth daily.     temazepam (RESTORIL) 15 MG capsule Take 15 mg by mouth at bedtime as needed for sleep.     No current facility-administered medications on file prior to visit.    Allergies  Allergen Reactions   Lansoprazole     Other reaction(s): tongue burning   Latex Hives and Swelling   Omeprazole     Other reaction(s): doesn't work   Ropinirole     Other reaction(s): terrible side effects.   Semaglutide     Other reaction(s): nausea, gerd and bowel issues   Shellfish-Derived Products Nausea And Vomiting   Simvastatin-High Dose Other (See Comments)    Muscle aches, flu like symptoms.     Past Medical History:  Diagnosis Date   Breast cancer ([facility])    GERD (gastroesophageal reflux disease)    Hyperlipidemia    Hypothyroidism    Indigestion    S/P radiation therapy 06/14/14-07/12/14   right breast 50Gy total dose   Sleep apnea    CPAP   Syncope and collapse 2006   Wears glasses    Wears partial dentures     Past Surgical History:  Procedure Laterality Date   BREAST LUMPECTOMY WITH RADIOACTIVE SEED LOCALIZATION Right  05/18/2014   Procedure: BREAST LUMPECTOMY WITH RADIOACTIVE SEED LOCALIZATION;  Surgeon: Emelia Loron, MD;  Location: [facility];  Service: General;  Laterality: Right;   CARDIOVASCULAR STRESS TEST  12/17/2004   EF 73%. NO EVIDENCE OF ISCHEMIA   CARPAL TUNNEL RELEASE     LEFT  HAND   COLONOSCOPY     DILATION AND CURETTAGE OF UTERUS     x2   TUBAL LIGATION     US ECHOCARDIOGRAPHY  12/12/2004   EF 55-60%    Social History   Tobacco Use  Smoking Status Former   Packs/day: 0.50   Years: 15.00   Additional pack years: 0.00   Total pack years: 7.50   Types: Cigarettes   Quit date: 01/27/1970   Years since quitting: 52.3  Smokeless Tobacco Never    Social History   Substance and Sexual Activity  Alcohol Use No    Family History  Problem Relation Age of Onset   Coronary artery disease Father    Heart attack Father    Breast cancer Sister    Lymphoma Brother    Breast cancer Paternal Grandmother    Colon cancer Neg Hx    Stomach cancer Neg Hx     Reviw of Systems:  Noted in current history, otherwise review of systems is negative.   Physical Exam: There were no vitals taken for this visit.  No BP recorded.  {Refresh Note OR Click here to enter BP  :1}***    GEN:  Well nourished, well developed in no acute distress HEENT: Normal NECK: No JVD; No carotid bruits LYMPHATICS: No lymphadenopathy CARDIAC: RRR ***, no murmurs, rubs, gallops RESPIRATORY:  Clear to auscultation without rales, wheezing or rhonchi  ABDOMEN: Soft, non-tender, non-distended MUSCULOSKELETAL:  No edema; No deformity  SKIN: Warm and dry NEUROLOGIC:  Alert and oriented x 3    ECG:       Assessment / Plan:   1. Hypertension -       2. Hyperlipidemia-   3.  Interstitial lung disease:           Kristeen Miss, MD  06/02/2022 9:55 AM    [facility] [address] [place],  [address] [place], [place]  [address] Pager [phone] Phone: [phone]; Fax: [phone]

## 2022-06-03 ENCOUNTER — Encounter: Payer: Self-pay | Admitting: Cardiovascular Disease

## 2022-06-03 ENCOUNTER — Ambulatory Visit: Payer: Medicare PPO | Attending: Cardiovascular Disease | Admitting: Cardiovascular Disease

## 2022-06-03 VITALS — BP 132/80 | HR 79 | Ht 63.0 in | Wt 196.8 lb

## 2022-06-03 DIAGNOSIS — E782 Mixed hyperlipidemia: Secondary | ICD-10-CM

## 2022-06-03 DIAGNOSIS — I10 Essential (primary) hypertension: Secondary | ICD-10-CM

## 2022-06-03 NOTE — Patient Instructions (Signed)
Medication Instructions:  Your physician recommends that you continue on your current medications as directed. Please refer to the Current Medication list given to you today.  *If you need a refill on your cardiac medications before your next appointment, please call your pharmacy*   Lab Work: NONE If you have labs (blood work) drawn today and your tests are completely normal, you will receive your results only by: MyChart Message (if you have MyChart) OR A paper copy in the mail If you have any lab test that is abnormal or we need to change your treatment, we will call you to review the results.   Testing/Procedures: NONE   Follow-Up: At [facility], you and your health needs are our priority.  As part of our continuing mission to provide you with exceptional heart care, we have created designated Provider Care Teams.  These Care Teams include your primary Cardiologist (physician) and Advanced Practice Providers (APPs -  Physician Assistants and Nurse Practitioners) who all work together to provide you with the care you need, when you need it.  We recommend signing up for the patient portal called "MyChart".  Sign up information is provided on this After Visit Summary.  MyChart is used to connect with patients for Virtual Visits (Telemedicine).  Patients are able to view lab/test results, encounter notes, upcoming appointments, etc.  Non-urgent messages can be sent to your provider as well.   To learn more about what you can do with MyChart, go to [email or web address].    Your next appointment:   1 year(s)  Provider:   Philip Nahser, MD      

## 2022-07-07 ENCOUNTER — Ambulatory Visit: Payer: Medicare PPO | Admitting: Podiatry

## 2022-07-09 ENCOUNTER — Ambulatory Visit: Payer: Medicare PPO | Admitting: Podiatry

## 2022-07-09 ENCOUNTER — Encounter: Payer: Self-pay | Admitting: Podiatry

## 2022-07-09 VITALS — BP 140/81 | HR 68

## 2022-07-09 DIAGNOSIS — B351 Tinea unguium: Secondary | ICD-10-CM

## 2022-07-09 DIAGNOSIS — M79675 Pain in left toe(s): Secondary | ICD-10-CM | POA: Diagnosis not present

## 2022-07-09 DIAGNOSIS — M79674 Pain in right toe(s): Secondary | ICD-10-CM

## 2022-07-13 NOTE — Progress Notes (Signed)
  Subjective:  Patient ID: Sydney Wood, female    DOB: 07-25-1935,  MRN: [id number]  Sydney Wood presents to clinic today for: painful elongated mycotic toenails 1-5 bilaterally which are tender when wearing enclosed shoe gear. Pain is relieved with periodic professional debridement.  Chief Complaint  Patient presents with   NAIL CARE    RFC     PCP is Rodrigo Ran, MD.  Allergies  Allergen Reactions   Lansoprazole     Other reaction(s): tongue burning   Latex Hives and Swelling   Omeprazole     Other reaction(s): doesn't work   Ropinirole     Other reaction(s): terrible side effects.   Semaglutide     Other reaction(s): nausea, gerd and bowel issues   Shellfish-Derived Products Nausea And Vomiting   Simvastatin-High Dose Other (See Comments)    Muscle aches, flu like symptoms.     Review of Systems: Negative except as noted in the HPI.  Objective: No changes noted in today's physical examination. Vitals:   07/09/22 1127  BP: (!) 140/81  Pulse: 68  SpO2: 95%    Sydney Wood is a pleasant 87 y.o. female in NAD. AAO x 3.  Vascular Examination: Capillary refill time immediate b/l LE. Palpable pedal pulses b/l LE. Digital hair absent b/l. No pedal edema b/l. Skin temperature gradient WNL b/l. No varicosities b/l. Marland Kitchen  Dermatological Examination: Pedal skin with normal turgor, texture and tone b/l. No open wounds. No interdigital macerations b/l. Toenails 1-5 b/l thickened, discolored, dystrophic with subungual debris. There is pain on palpation to dorsal aspect of nailplates. Hyperkeratotic lesion(s) submet head 1 b/l.  No erythema, no edema, no drainage, no fluctuance..  Neurological Examination: Protective sensation intact with 10 gram monofilament b/l LE. Vibratory sensation intact b/l LE.   Musculoskeletal Examination: Normal muscle strength 5/5 to all lower extremity muscle groups bilaterally. Hammertoe(s) noted to the bilateral 5th toes.. No pain,  crepitus or joint limitation noted with ROM b/l LE.  Patient ambulates independently without assistive aids.  Assessment/Plan: 1. Pain due to onychomycosis of toenails of both feet   -Consent given for treatment as described below: -Examined patient. -Patient refused callus debridement today. -Patient to continue soft, supportive shoe gear daily. -Toenails 1-5 b/l were debrided in length and girth with sterile nail nippers and dremel without iatrogenic bleeding.  -Patient/POA to call should there be question/concern in the interim.   Return in about 3 months (around 10/09/2022).  Freddie Breech, DPM

## 2022-07-31 IMAGING — CT CT CHEST W/O CM
2 of 4 series · 15 of 36 positions shown, 18 images · non-contrast
Comparison: CT chest dated February 21, 2020

CLINICAL DATA: Interstitial lung disease

EXAM:
CT CHEST WITHOUT CONTRAST
TECHNIQUE: Multidetector CT imaging of the chest was performed following the
standard protocol without IV contrast.

[Series 2: thorax · axial · 0.74mm/px · z∈[-278,-28]mm · 12 of 149 slices shown, 15 images]
[im 12/149  mediastinal]
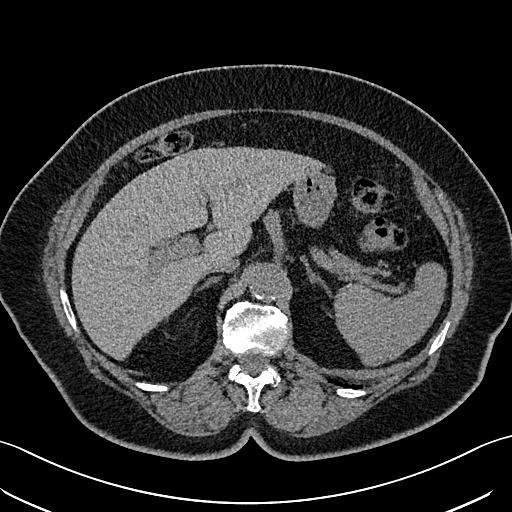
[im 12/149  lung]
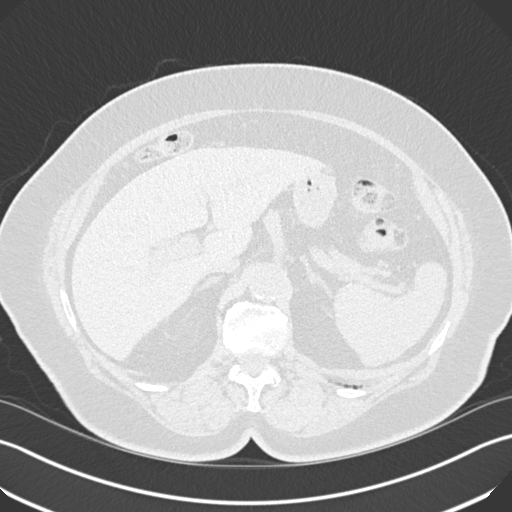
[im 23/149  lung]
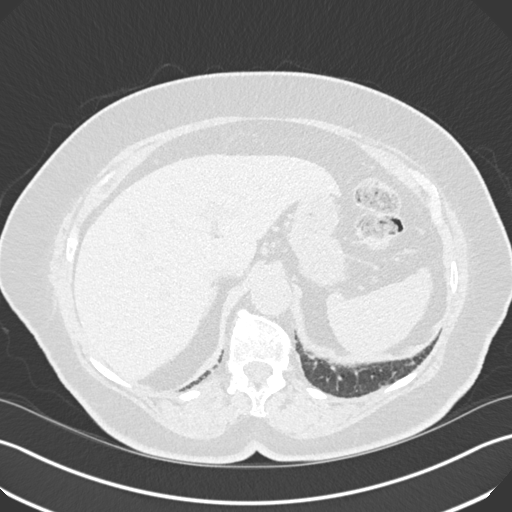
[im 35/149  lung]
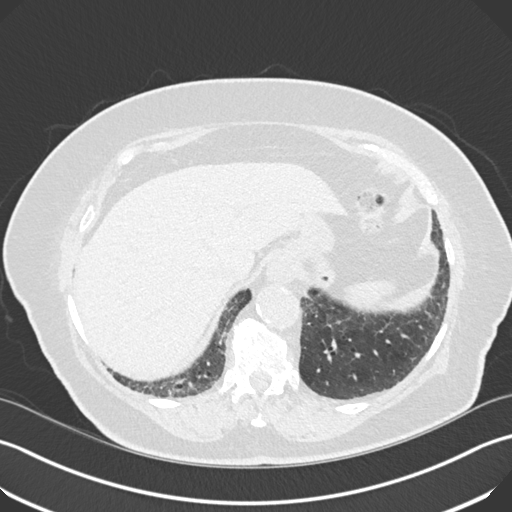
[im 46/149  lung]
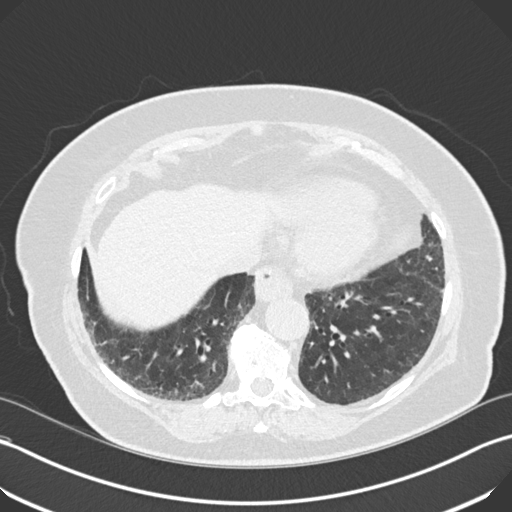
[im 57/149  mediastinal]
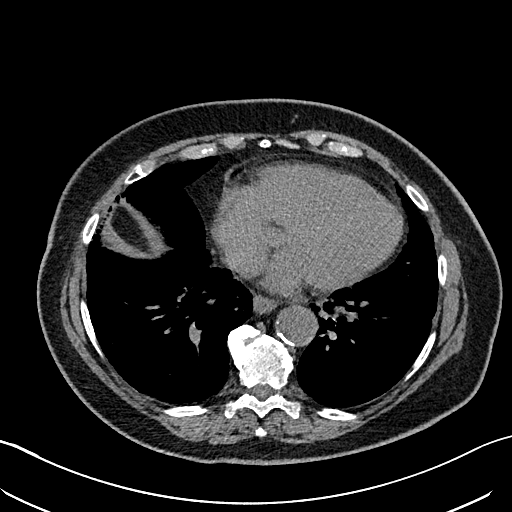
[im 57/149  lung]
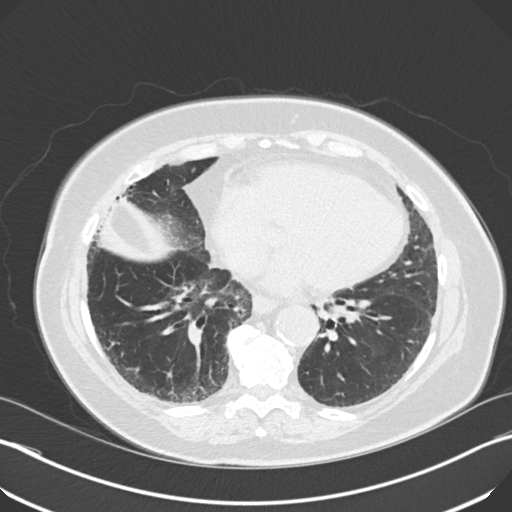
[im 69/149  lung]
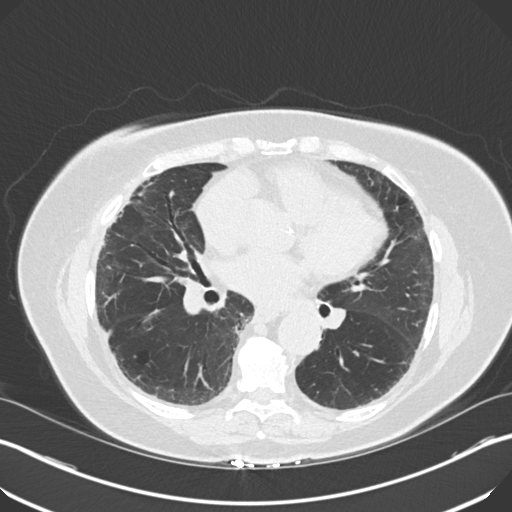
[im 80/149  lung]
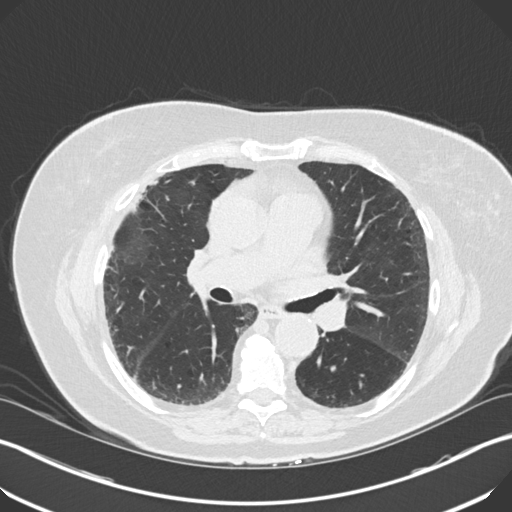
[im 92/149  lung]
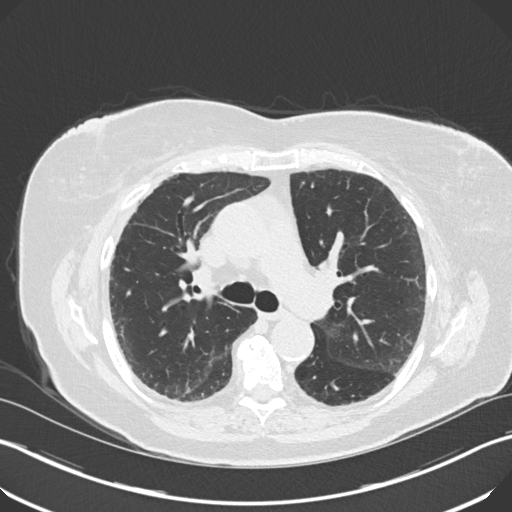
[im 103/149  mediastinal]
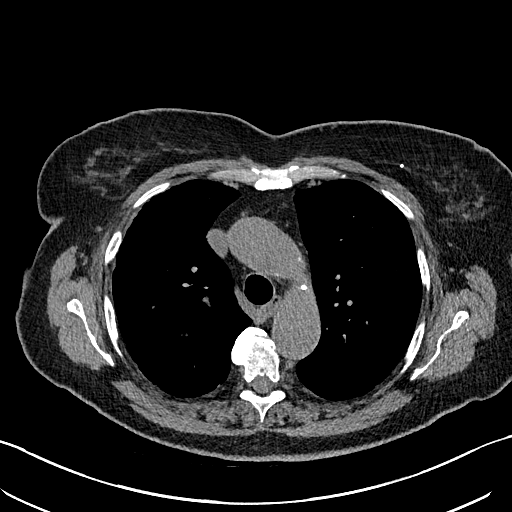
[im 103/149  lung]
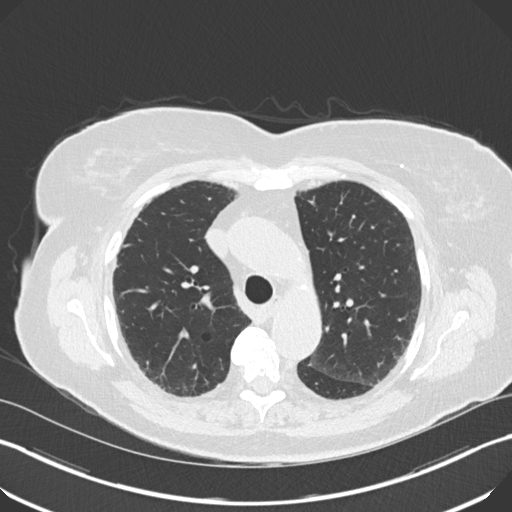
[im 114/149  lung]
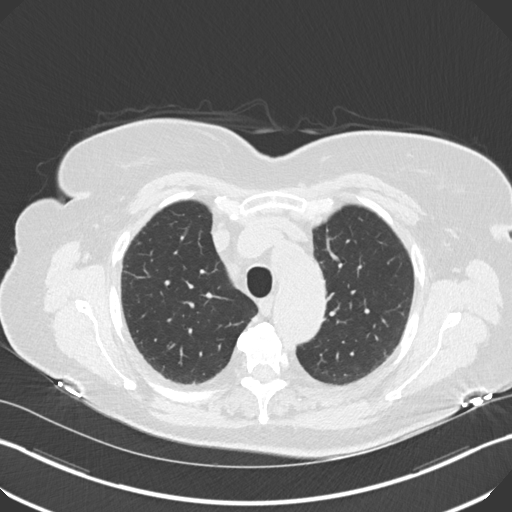
[im 126/149  lung]
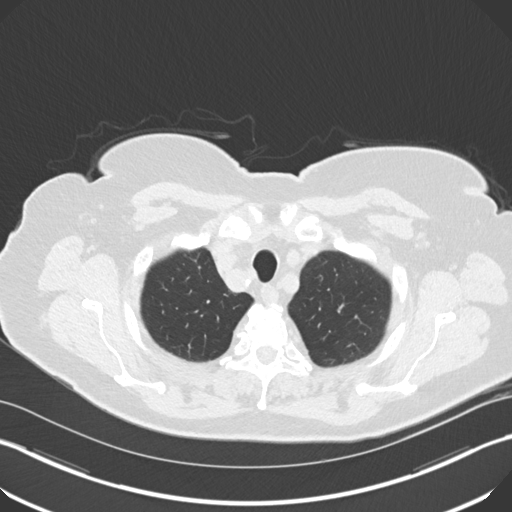
[im 137/149  lung]
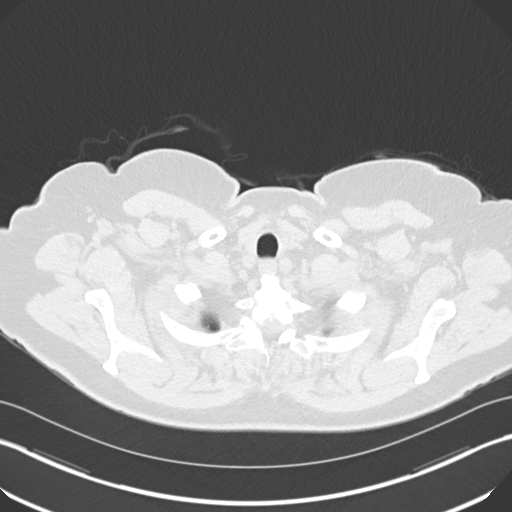

[Series 5: coronal · coronal · 0.59mm/px · 3 of 125 slices shown]
[im 25/125  lung]
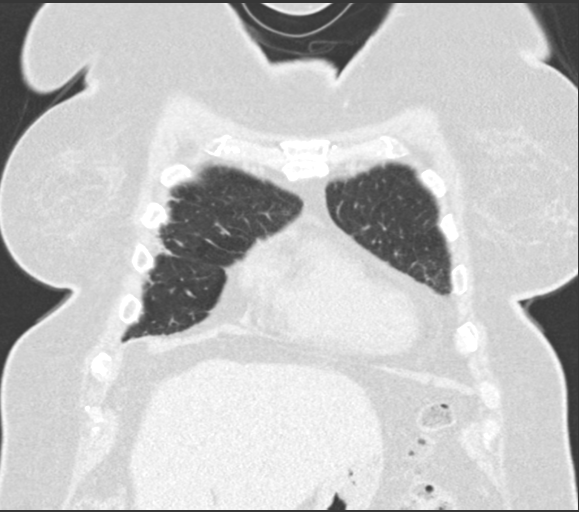
[im 50/125  lung]
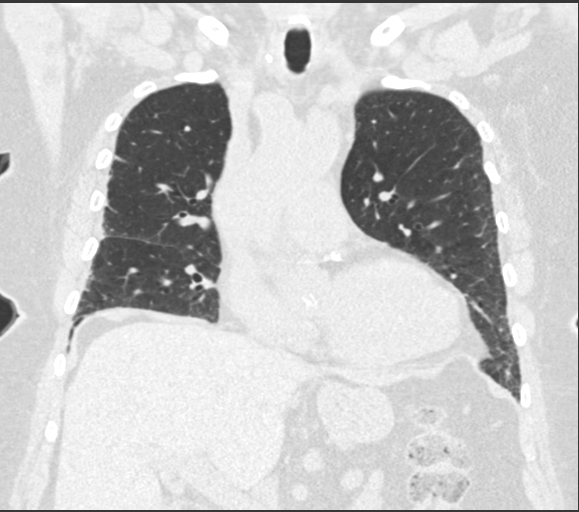
[im 75/125  lung]
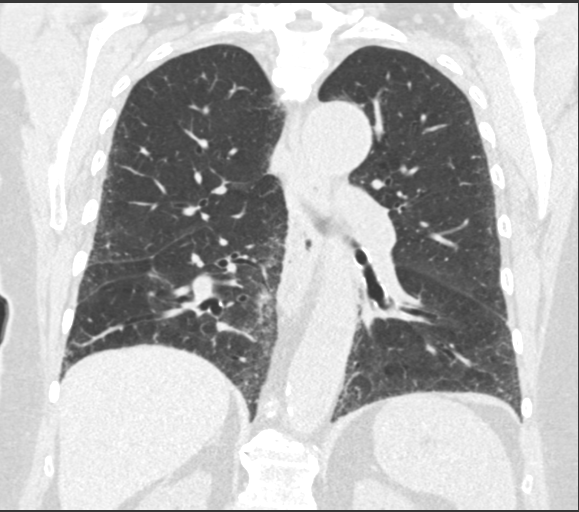

[15 of 36 positions shown; findings below may reference images not displayed]

FINDINGS: Cardiovascular: Mild cardiomegaly. No pericardial effusion.
Calcifications of the left main. Aortic atherosclerotic disease.
Enlarged main pulmonary artery, unchanged compared to prior.

Mediastinum/Nodes: Small hiatal hernia. No pathologically enlarged
lymph nodes seen in the chest.

Lungs/Pleura: Central airways are patent. No consolidation, pleural
effusion or pneumothorax. Subpleural basilar predominant reticular
opacities with mild associated ground-glass and traction
bronchiectasis. Probable early honeycomb change of the anterior
right middle lobe. A component of the fibrotic changes in the
anterior right upper lobe may be related to post radiation changes.
Possible slight interval progression with more reticular opacities,
particularly in the left lower lobe.

Upper Abdomen: No acute abnormality.

Musculoskeletal: No chest wall mass or suspicious bone lesions
identified.
IMPRESSION: Fibrotic interstitial lung disease with possible slight interval
progression. Findings are categorized as probable UIP per consensus
guidelines: Diagnosis of Idiopathic Pulmonary Fibrosis: An Official
ATS/ERS/JRS/ALAT Clinical Practice Guideline. Am J Respir Crit Care
Med Vol 198, Lambert 5, [id number][DATE]. Fibrotic hypersensitivity
pneumonitis is an additional consideration given presence of air
trapping on prior exam.

Enlarged main pulmonary artery which can be seen in the setting of
pulmonary hypertension.

Coronary artery calcification of the left main and aortic
Atherosclerosis (C66DE-W89.9).

## 2022-08-04 DIAGNOSIS — Z1231 Encounter for screening mammogram for malignant neoplasm of breast: Secondary | ICD-10-CM | POA: Diagnosis not present

## 2022-08-05 DIAGNOSIS — D649 Anemia, unspecified: Secondary | ICD-10-CM | POA: Diagnosis not present

## 2022-08-05 DIAGNOSIS — R7301 Impaired fasting glucose: Secondary | ICD-10-CM | POA: Diagnosis not present

## 2022-08-05 DIAGNOSIS — I10 Essential (primary) hypertension: Secondary | ICD-10-CM | POA: Diagnosis not present

## 2022-08-05 DIAGNOSIS — E785 Hyperlipidemia, unspecified: Secondary | ICD-10-CM | POA: Diagnosis not present

## 2022-08-05 DIAGNOSIS — R110 Nausea: Secondary | ICD-10-CM | POA: Diagnosis not present

## 2022-08-05 DIAGNOSIS — J849 Interstitial pulmonary disease, unspecified: Secondary | ICD-10-CM | POA: Diagnosis not present

## 2022-08-05 DIAGNOSIS — K769 Liver disease, unspecified: Secondary | ICD-10-CM | POA: Diagnosis not present

## 2022-08-05 DIAGNOSIS — E039 Hypothyroidism, unspecified: Secondary | ICD-10-CM | POA: Diagnosis not present

## 2022-08-06 ENCOUNTER — Telehealth: Payer: Self-pay | Admitting: Cardiovascular Disease

## 2022-08-06 ENCOUNTER — Other Ambulatory Visit: Payer: Self-pay | Admitting: Internal Medicine

## 2022-08-06 DIAGNOSIS — K769 Liver disease, unspecified: Secondary | ICD-10-CM

## 2022-08-06 NOTE — Telephone Encounter (Signed)
Patient reports shortness of breath for last 2-3 months.  She is not sure if she discussed with Dr Elease Hashimoto at office visit in May but feels shortness of breath is worsening.  Always has fatigue but it has worsened in the last month.  Shortness of breath is with exertion and improves with rest. Also sees pulmonary but has not contacted that office. No chest pain.  Saw Dr Waynard Edwards yesterday and was advised to make Dr Elease Hashimoto aware of shortness of breath. I told patient message would be sent to [place] and we would call her back with his recommendations.

## 2022-08-06 NOTE — Telephone Encounter (Signed)
  Per MyChart Schedulig message:  Pt c/o Shortness Of Breath: STAT if SOB developed within the last 24 hours or pt is noticeably SOB on the phone  1. Are you currently SOB (can you hear that pt is SOB on the phone)?   2. How long have you been experiencing SOB?   3. Are you SOB when sitting or when up moving around?   4. Are you currently experiencing any other symptoms?     I experience shortness of breath if I walk or do any activity for only a short time.  I have had this happen for two or three months.  I do not have this when sitting.  I do feel tired a lot.  I have frequent nausea episodes.  Most are in the middle of the night.   Thank Allesandra, [place] [phone]

## 2022-08-08 NOTE — Telephone Encounter (Signed)
Called patient to discuss symptoms. She does condone worsening SOB, but has pulmonary fibrosis so cannot rule out if this disease process is worsening. Had ECHO 04/02/22 which showed normal EF, no major valve issues, or diastolic dysfunction. Advised that she call her pulmonologist to discuss moving up her already scheduled pulmonary function test. Educated that it could be due to humidity and heat lately. She agrees to plan. States her overarching concern is really the nausea that she's having during the night which a new symptom for her, but she cannot exactly state when this began. She denies any recent diet changes, activity changes, questioned on recent supplements or OTC medications and she does state that she's began using magnesium to help her restless legs. Further questioning about this and realized she's taking it at bedtime. Educated that magnesium can cause nausea and should be taken with a meal to help with absorption. Advised that she move it up and take it with her supper meal and call us back if that doesn't fix her nausea issue.

## 2022-08-14 DIAGNOSIS — M8589 Other specified disorders of bone density and structure, multiple sites: Secondary | ICD-10-CM | POA: Diagnosis not present

## 2022-08-21 DIAGNOSIS — G4700 Insomnia, unspecified: Secondary | ICD-10-CM | POA: Diagnosis not present

## 2022-08-21 DIAGNOSIS — T7840XA Allergy, unspecified, initial encounter: Secondary | ICD-10-CM | POA: Diagnosis not present

## 2022-08-28 ENCOUNTER — Ambulatory Visit
Admission: RE | Admit: 2022-08-28 | Discharge: 2022-08-28 | Disposition: A | Discharge status: Home / Self Care | Payer: Medicare PPO | Source: Ambulatory Visit | Attending: Internal Medicine | Admitting: Internal Medicine

## 2022-08-28 DIAGNOSIS — K769 Liver disease, unspecified: Secondary | ICD-10-CM

## 2022-08-28 DIAGNOSIS — K449 Diaphragmatic hernia without obstruction or gangrene: Secondary | ICD-10-CM | POA: Diagnosis not present

## 2022-08-28 DIAGNOSIS — K7689 Other specified diseases of liver: Secondary | ICD-10-CM | POA: Diagnosis not present

## 2022-08-28 DIAGNOSIS — C50919 Malignant neoplasm of unspecified site of unspecified female breast: Secondary | ICD-10-CM | POA: Diagnosis not present

## 2022-08-28 MED ORDER — IOPAMIDOL (ISOVUE-300) INJECTION 61%
100.0000 mL | Freq: Once | INTRAVENOUS | Status: AC | PRN
  Administered 2022-08-28: 100 mL via INTRAVENOUS

## 2022-09-30 DIAGNOSIS — M47812 Spondylosis without myelopathy or radiculopathy, cervical region: Secondary | ICD-10-CM | POA: Diagnosis not present

## 2022-09-30 DIAGNOSIS — M19042 Primary osteoarthritis, left hand: Secondary | ICD-10-CM | POA: Diagnosis not present

## 2022-09-30 DIAGNOSIS — M19041 Primary osteoarthritis, right hand: Secondary | ICD-10-CM | POA: Diagnosis not present

## 2022-10-14 ENCOUNTER — Encounter: Payer: Self-pay | Admitting: Podiatry

## 2022-10-14 ENCOUNTER — Ambulatory Visit: Payer: Medicare PPO | Admitting: Podiatry

## 2022-10-14 DIAGNOSIS — M79675 Pain in left toe(s): Secondary | ICD-10-CM | POA: Diagnosis not present

## 2022-10-14 DIAGNOSIS — B351 Tinea unguium: Secondary | ICD-10-CM

## 2022-10-14 DIAGNOSIS — M79674 Pain in right toe(s): Secondary | ICD-10-CM | POA: Diagnosis not present

## 2022-10-14 NOTE — Progress Notes (Signed)
Subjective:  Patient ID: Sydney Wood, female    DOB: 03-May-1935,  MRN: [id number]  Sydney Wood presents to clinic today for painful thick toenails that are difficult to trim. Pain interferes with ambulation. Aggravating factors include wearing enclosed shoe gear. Pain is relieved with periodic professional debridement.  Chief Complaint  Patient presents with   RFC    RFC   New problem(s): None.   PCP is Rodrigo Ran, MD.  Allergies  Allergen Reactions   Lansoprazole     Other reaction(s): tongue burning   Latex Hives and Swelling   Omeprazole     Other reaction(s): doesn't work   Ropinirole     Other reaction(s): terrible side effects.   Semaglutide     Other reaction(s): nausea, gerd and bowel issues   Shellfish-Derived Products Nausea And Vomiting   Simvastatin-High Dose Other (See Comments)    Muscle aches, flu like symptoms.     Review of Systems: Negative except as noted in the HPI.  Objective: No changes noted in today's physical examination. There were no vitals filed for this visit. Sydney Wood is a pleasant 87 y.o. female in NAD. AAO x 3.  Vascular Examination: Capillary refill time immediate b/l LE. Palpable pedal pulses b/l LE. Digital hair absent b/l. No pedal edema b/l. Skin temperature gradient WNL b/l. No varicosities b/l. Marland Kitchen  Dermatological Examination: Pedal skin with normal turgor, texture and tone b/l. No open wounds. No interdigital macerations b/l. Toenails 1-5 b/l thickened, discolored, dystrophic with subungual debris. There is pain on palpation to dorsal aspect of nailplates.   Neurological Examination: Protective sensation intact with 10 gram monofilament b/l LE. Vibratory sensation intact b/l LE.   Musculoskeletal Examination: Normal muscle strength 5/5 to all lower extremity muscle groups bilaterally. Hammertoe(s) noted to the bilateral 5th toes.. No pain, crepitus or joint limitation noted with ROM b/l LE.  Patient ambulates  independently without assistive aids.  Assessment/Plan: 1. Pain due to onychomycosis of toenails of both feet     Patient was evaluated and treated. All patient's and/or POA's questions/concerns addressed on today's visit. Mycotic toenails 1-5 debrided in length and girth without incident. Continue soft, supportive shoe gear daily. Report any pedal injuries to medical professional. Call office if there are any quesitons/concerns. -Patient/POA to call should there be question/concern in the interim.   Return in about 3 months (around 01/13/2023).  Freddie Breech, DPM

## 2022-10-21 ENCOUNTER — Encounter (HOSPITAL_BASED_OUTPATIENT_CLINIC_OR_DEPARTMENT_OTHER): Payer: Self-pay | Admitting: Pulmonary Disease

## 2022-10-21 ENCOUNTER — Ambulatory Visit (HOSPITAL_BASED_OUTPATIENT_CLINIC_OR_DEPARTMENT_OTHER): Payer: Medicare PPO | Admitting: Pulmonary Disease

## 2022-10-21 VITALS — BP 124/70 | HR 77 | Resp 16 | Ht 63.0 in | Wt 198.8 lb

## 2022-10-21 DIAGNOSIS — G4733 Obstructive sleep apnea (adult) (pediatric): Secondary | ICD-10-CM

## 2022-10-21 DIAGNOSIS — J849 Interstitial pulmonary disease, unspecified: Secondary | ICD-10-CM | POA: Diagnosis not present

## 2022-10-21 NOTE — Progress Notes (Signed)
Subjective:   PATIENT ID: Sydney Wood GENDER: female DOB: 1935/12/12, MRN: [id number]   HPI  Chief Complaint  Patient presents with   Follow-up    Osa on cpap,  ILD   Reason for Visit: Follow-up   Sydney Wood is an 87 year old female with OSA on CPAP, history of right breast cancer s/p lumpectomy and radiation in remission, hypertension, hyperlipidemia, hypothyroidism, reflux who presents for follow-up.  Synopsis: Initially referred by PCP Dr. Waynard Edwards on 01/30/2020 for abnormal CT chest demonstrating extra mucus in the airways and scarring. She reports long standing history of shortness of breath with exertion with short-medium distances. She gets out of breath after walking a flight of stairs. She will need to rest after walking in the grocery store after 10 minutes. She is unsure of when her symptoms began but it has been gradually worsening and now associated with a cough. She describes having a non-productive cough. No wheezing. Has not measured oxygen levels at home.  2022 - Diagnosed with ILD with associated restrictive defect on PFTs. Treated for flare in June with prednisone. CT with slight progression of GGO.  12/26/20 Overall, she feels stable. No limitations in activity. Denies shortness of breath, cough or wheezing. She had an echocardiogram for murmur. She is compliant with her CPAP nightly 6-8 hours which she reports benefit in sleep quality.   04/01/21 Since her last visit she denies shortness of breath, cough or wheezing. She compliant with CPAP nightly and reports her sleep has improved. She is taking Ozempic for her weight loss and has lost 7 lbs and noticing that she is fitting her clothing better.  10/01/21 Since our last visit, she denies SOB, cough or wheezing. Compliant with CPAP nightly and reports benefit in sleep quality. Completed PFTs and awaiting results.  04/15/22 Since our last visit she reports she is overall doing well. She is compliant with  CPAP nightly. Sometimes exercises by walking 1-2 times a week. Denies shortness of breath, cough and wheezing. Planning for [employer] for her 87th birthday this summer.  10/21/22 Since our last visit overall doing well. She is compliant with CPAP. She has been slowly weaning herself off Temazepam. Still sleeping well. Has unchanged shortness of breath with exertion but this does not prevent her from accomplishing activities. Denies cough or wheezing.  Social History: Former smoker. Quit in 1972. Smoked 1/2 in her 64s beginning in college. 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 Had to evacuate one room due to asbestos Husband hospitalized in 09/2020 for cardiac cath  Environmental exposures:  Radiation  Past Medical History:  Diagnosis Date   Breast cancer (HCC)    GERD (gastroesophageal reflux disease)    Hyperlipidemia    Hypothyroidism    Indigestion    S/P radiation therapy 06/14/14-07/12/14   right breast 50Gy total dose   Sleep apnea    CPAP   Syncope and collapse 2006   Wears glasses    Wears partial dentures     Allergies  Allergen Reactions   Lansoprazole     Other reaction(s): tongue burning   Latex Hives and Swelling   Omeprazole     Other reaction(s): doesn't work   Ropinirole     Other reaction(s): terrible side effects.   Semaglutide     Other reaction(s): nausea, gerd and bowel issues   Shellfish-Derived Products Nausea And Vomiting   Simvastatin-High Dose Other (See Comments)    Muscle aches, flu like symptoms.      Outpatient Medications Prior to Visit  Medication Sig Dispense Refill   acetaminophen (TYLENOL) 325 MG tablet Take 650 mg by mouth every 6 (six) hours as needed.     atorvastatin (LIPITOR) 10 MG tablet Take 10 mg by mouth daily.  1   cholecalciferol (VITAMIN D) 400  UNITS TABS Take 400 Units by mouth daily.     doxepin (SINEQUAN) 50 MG capsule Take 50 mg daily by mouth.     Levothyroxine Sodium (SYNTHROID PO) Take 150 mcg by mouth daily.      Magnesium 200 MG TABS Take 1,000 mg daily by mouth.      Multiple Vitamin (MULTIVITAMIN) tablet Take 1 tablet by mouth daily.     omeprazole (PRILOSEC) 20 MG capsule Take 1 capsule by mouth daily.     rOPINIRole (REQUIP) 0.5 MG tablet Take 0.5 mg by mouth daily.  0   telmisartan (MICARDIS) 40 MG tablet Take 40 mg by mouth daily.     temazepam (RESTORIL) 15 MG capsule Take 15 mg by mouth at bedtime as needed for sleep.     No facility-administered medications prior to visit.    Review of Systems  Constitutional:  Negative for chills, diaphoresis, fever, malaise/fatigue and weight loss.  HENT:  Negative for congestion.   Respiratory:  Positive for shortness of breath. Negative for cough, hemoptysis, sputum production and wheezing.   Cardiovascular:  Negative for chest pain, palpitations and leg swelling.     Objective:   Vitals:   10/21/22 1404  BP: 124/70  Pulse: 77  Resp: 16  SpO2: 95%  Weight: 198 lb 12.8 oz (90.2 kg)  Height: 5\' 3"  (1.6 m)   SpO2: 95 %  Physical Exam: General: Well-appearing, no acute distress HENT: [place], AT Eyes: EOMI, no scleral icterus Respiratory: Bibasilar inspiratory crackles R>L Cardiovascular: RRR, -M/R/G, no JVD Extremities:-Edema,-tenderness Neuro: AAO x4, CNII-XII grossly intact Psych: Normal mood, normal affect  Data Reviewed:  Imaging: CT chest HR 02/21/2020-basilar and subpleural groundglass with minimal reticulation and single-layer honeycombing notable in the right middle lobe.  Mosaic attenuation.  Traction bronchiectasis present.  Enlarged PA suggestive of pulmonary arterial hypertension. CT Chest 10/26/20 - Slight progression of GGO with minimal reticulation and septal thickening. Possible early honeycombing  CT Chest 09/19/21 - unchanged GGO, septal  thickening and reticulation, mild bronchiectasis CT A/P 08/28/22 - Reviewed lower lobe lung fields. Unchanged septal thickening and mild bronchiectasis and GGO and honeycombing as noted one year ago  PFT: 06/28/20 FVC 2.15 (91%) FEV1 1.57 (90%) Ratio 86  TLC 72% DLCO 93% Interpretation: Mild restrictive defect. Normal  DLCO  10/01/21 FVC 2.07 (89%) FEV1 1.78 (104%) Ratio 84  TLC Unable to obtain d/t claustrophobia DLCO 79% Interpretation: DLCO compared to 06/2020 is reduced from 93 to 79%  Sleep test 08/21/20 HST - AHI 8.7 SpO2 nadir 83%. Recommend auto CPAP 5-15 cm H20  Labs: [employer] labs Basic metabolic panel reviewed.  12/07/2019 Sodium 129, glucose 131.  Remaining values within normal limits CBC reviewed 10/25/2019 WBC 6.35 Absolute eosinophils 100  Echocardiogram: 11/12/20 - EF 63%. Indeterminate mitral regurg, grade I DD  02/14/22-04/14/22 Days usage 60/60 days 100% >4 hours 100% Auto PAP 5-15 cm H20 AHI 0.3  07/23/22-10/20/22 90/90 days (100%) >4 hours 87 days (97%) AutoCPAP 5-15 cm H20 AHI 0.3    Assessment & Plan:   Discussion: 87 year old female with OSA on CAPA, hx of right breast cancer s/p lumpectomy and radiation in remission who presents for follow-up for ILD and OSA. Overall minimal symptoms except for mild DOE. Not on bronchodilators due to ineffectiveness and side effects (failed Breo). Reviewed CT A/P lung fields with unchanged ILD findings.  Mild OSA Patient uses NIV for more than four hours nightly for at least 70% of nights during the last three months of usage. The patient has been using and benefiting from PAP use and will continue to benefit from therapy.  --Counseled on sleep hygiene --Counseled on weight loss/maintenance of healthy weight --Counseled NOT to drive if/when sleepy --Advised patient to wear CPAP for at least 4 hours each night for greater than 70% of the time to avoid the machine being repossessed by insurance.  ILD, unknown  etiology --Prior radiation exposure however bilateral involvement not consistent. --PFTs with reduced DLCO however overall normal echocardiogram with no evidence of pulmonary hypertension --Defer autoimmune work-up --Encourage regular aerobic exercise at least three times a week --Ambulatory O2 today with nadir SpOe 92% --ORDER pulmonary function tests (January 2025)   Health Maintenance Immunization History  Administered Date(s) Administered   Fluad Quad(high Dose 65+) 10/29/2020   Influenza Split 11/08/2008, 11/20/2009, 11/07/2010, 02/06/2011, 11/18/2011, 05/16/2013, 10/14/2013   Influenza, High Dose Seasonal PF 11/07/2015, 11/11/2016   Influenza, Quadrivalent, Recombinant, Inj, Pf 09/17/2017, 10/28/2018, 11/26/2019, 11/23/2020   Influenza,inj,Quad PF,6+ Mos 11/01/2012, 10/14/2013, 10/28/2014   PFIZER Comirnaty(Gray Top)Covid-19 Tri-Sucrose Vaccine 08/13/2020   PFIZER(Purple Top)SARS-COV-2 Vaccination 02/06/2019, 02/26/2019, 11/01/2019   Pneumococcal Conjugate-13 09/08/2013   Pneumococcal Polysaccharide-23 09/16/2001, 12/11/2008, 02/06/2011   Td 08/29/2002, 02/06/2011, 04/29/2012   Tdap 04/29/2012, 08/17/2012   Zoster Recombinant(Shingrix) 06/29/2017, 08/28/2017   Zoster, Live 02/25/2005, 02/06/2011, 08/28/2017   CT Lung Screen - not a candidate. Insufficient smoking history  Orders Placed This Encounter  Procedures   Pulmonary function test    Standing Status:   Future    Standing Expiration Date:   10/21/2023    Order Specific Question:   Where should this test be performed?    Answer:   [facility]    Order Specific Question:   Full PFT: includes the following: basic spirometry, spirometry pre & post bronchodilator, diffusion capacity (DLCO), lung volumes    Answer:   Full PFT   No orders of the defined types were placed in this encounter.  Return for January.  I have spent a total time of 33-minutes on the day of the appointment including chart review, data  review, collecting history, coordinating care and discussing medical diagnosis and plan with the patient/family. Past medical history, allergies, medications were reviewed. Pertinent imaging, labs and tests included in this note have been reviewed  and interpreted independently by me.  Gavina Dildine Mechele Collin, MD [facility] 10/21/2022 3:02 PM  Office Number [phone]

## 2022-10-21 NOTE — Patient Instructions (Signed)
Mild OSA Patient uses NIV for more than four hours nightly for at least 70% of nights during the last three months of usage. The patient has been using and benefiting from PAP use and will continue to benefit from therapy.  --Counseled on sleep hygiene --Counseled on weight loss/maintenance of healthy weight --Counseled NOT to drive if/when sleepy --Advised patient to wear CPAP for at least 4 hours each night for greater than 70% of the time to avoid the machine being repossessed by insurance.  ILD, unknown etiology --Prior radiation exposure however bilateral involvement not consistent. --PFTs with reduced DLCO however overall normal echocardiogram with no evidence of pulmonary hypertension --Defer autoimmune work-up --Encourage regular aerobic exercise at least three times a week --Ambulatory O2 today with nadir SpOe 92% --ORDER pulmonary function tests (January 2025)

## 2022-11-04 DIAGNOSIS — G5601 Carpal tunnel syndrome, right upper limb: Secondary | ICD-10-CM | POA: Diagnosis not present

## 2022-11-19 DIAGNOSIS — L820 Inflamed seborrheic keratosis: Secondary | ICD-10-CM | POA: Diagnosis not present

## 2022-11-19 DIAGNOSIS — L650 Telogen effluvium: Secondary | ICD-10-CM | POA: Diagnosis not present

## 2022-11-19 DIAGNOSIS — L649 Androgenic alopecia, unspecified: Secondary | ICD-10-CM | POA: Diagnosis not present

## 2022-11-21 DIAGNOSIS — G5601 Carpal tunnel syndrome, right upper limb: Secondary | ICD-10-CM | POA: Diagnosis not present

## 2022-11-23 DIAGNOSIS — G4733 Obstructive sleep apnea (adult) (pediatric): Secondary | ICD-10-CM | POA: Diagnosis not present

## 2023-01-13 ENCOUNTER — Ambulatory Visit: Payer: Medicare PPO | Admitting: Podiatry

## 2023-01-13 ENCOUNTER — Encounter: Payer: Self-pay | Admitting: Podiatry

## 2023-01-13 DIAGNOSIS — M79675 Pain in left toe(s): Secondary | ICD-10-CM | POA: Diagnosis not present

## 2023-01-13 DIAGNOSIS — M79674 Pain in right toe(s): Secondary | ICD-10-CM | POA: Diagnosis not present

## 2023-01-13 DIAGNOSIS — B351 Tinea unguium: Secondary | ICD-10-CM

## 2023-01-13 DIAGNOSIS — T7840XA Allergy, unspecified, initial encounter: Secondary | ICD-10-CM | POA: Insufficient documentation

## 2023-01-15 DIAGNOSIS — I10 Essential (primary) hypertension: Secondary | ICD-10-CM | POA: Diagnosis not present

## 2023-01-15 DIAGNOSIS — H524 Presbyopia: Secondary | ICD-10-CM | POA: Diagnosis not present

## 2023-01-15 DIAGNOSIS — H35033 Hypertensive retinopathy, bilateral: Secondary | ICD-10-CM | POA: Diagnosis not present

## 2023-01-15 DIAGNOSIS — H53143 Visual discomfort, bilateral: Secondary | ICD-10-CM | POA: Diagnosis not present

## 2023-01-15 DIAGNOSIS — H5203 Hypermetropia, bilateral: Secondary | ICD-10-CM | POA: Diagnosis not present

## 2023-01-15 DIAGNOSIS — H25813 Combined forms of age-related cataract, bilateral: Secondary | ICD-10-CM | POA: Diagnosis not present

## 2023-01-15 DIAGNOSIS — H52223 Regular astigmatism, bilateral: Secondary | ICD-10-CM | POA: Diagnosis not present

## 2023-01-15 DIAGNOSIS — H04123 Dry eye syndrome of bilateral lacrimal glands: Secondary | ICD-10-CM | POA: Diagnosis not present

## 2023-01-18 NOTE — Progress Notes (Signed)
  Subjective:  Patient ID: Sydney Wood, female    DOB: 1935-11-17,  MRN: [id number]  87 y.o. female presents to clinic with  painful, discolored, thick toenails which interfere with daily activities  Chief Complaint  Patient presents with   Nail Problem    Patient states she saw her pcp 6 months ago      New problem(s): None   PCP is Rodrigo Ran, MD.  Allergies  Allergen Reactions   Lansoprazole     Other reaction(s): tongue burning   Latex Hives and Swelling   Omeprazole     Other reaction(s): doesn't work   Ropinirole     Other reaction(s): terrible side effects.   Semaglutide     Other reaction(s): nausea, gerd and bowel issues   Shellfish-Derived Products Nausea And Vomiting   Simvastatin-High Dose Other (See Comments)    Muscle aches, flu like symptoms.     Review of Systems: Negative except as noted in the HPI.   Objective:  Sydney Wood is a pleasant 87 y.o. female in NAD.Marland Kitchen AAO x 3.  Vascular Examination: Vascular status intact b/l with palpable pedal pulses. CFT immediate b/l. No edema. No pain with calf compression b/l. Skin temperature gradient WNL b/l. No cyanosis or clubbing noted b/l LE.  Neurological Examination: Sensation grossly intact b/l with 10 gram monofilament. Vibratory sensation intact b/l.   Dermatological Examination: Pedal skin with normal turgor, texture and tone b/l. Toenails 1-5 b/l thick, discolored, elongated with subungual debris and pain on dorsal palpation. No hyperkeratotic lesions noted b/l.   Musculoskeletal Examination: Muscle strength 5/5 to b/l LE. Hammertoe(s) noted to the L 5th toe and R 5th toe.  Radiographs: None  Last A1c:       No data to display           Assessment:   1. Pain due to onychomycosis of toenails of both feet    Plan:  -Consent given for treatment as described below: -Examined patient. -Continue supportive shoe gear daily. -Toenails 1-5 b/l were debrided in length and girth with  sterile nail nippers and dremel without iatrogenic bleeding.  -Patient/POA to call should there be question/concern in the interim.  Return in about 3 months (around 04/13/2023).  Freddie Breech, DPM       LOCATION: [address], [place] [address]                   Office [phone]   [facility] LOCATION: [address] [place], [place] [address] Office [phone]

## 2023-01-28 HISTORY — PX: OTHER SURGICAL HISTORY: SHX169

## 2023-02-05 ENCOUNTER — Ambulatory Visit (HOSPITAL_BASED_OUTPATIENT_CLINIC_OR_DEPARTMENT_OTHER): Payer: Medicare PPO | Admitting: Pulmonary Disease

## 2023-02-05 DIAGNOSIS — J849 Interstitial pulmonary disease, unspecified: Secondary | ICD-10-CM | POA: Diagnosis not present

## 2023-02-05 LAB — PULMONARY FUNCTION TEST
DL/VA % pred: 115 %
DL/VA: 4.7 ml/min/mmHg/L
DLCO cor % pred: 89 %
DLCO cor: 16.2 ml/min/mmHg
DLCO unc % pred: 89 %
DLCO unc: 16.2 ml/min/mmHg
FEF 25-75 Pre: 1.63 L/s
FEF2575-%Pred-Pre: 146 %
FEV1-%Pred-Pre: 99 %
FEV1-Pre: 1.68 L
FEV1FVC-%Pred-Pre: 110 %
FEV6-%Pred-Pre: 95 %
FEV6-Pre: 2.05 L
FEV6FVC-%Pred-Pre: 106 %
FVC-%Pred-Pre: 90 %
FVC-Pre: 2.09 L
Pre FEV1/FVC ratio: 80 %
Pre FEV6/FVC Ratio: 100 %

## 2023-02-05 NOTE — Progress Notes (Signed)
 Attempted full PFT today; Completed Spirometry and attempted DLCO. Pt experienced inspiratory stridor that resolved after 2 minutes. Pt was placed on 2L of Oxygen and was monitored in office and offered to take her down to the ED. Patient stated she felt normal again and was ready to go home.

## 2023-02-05 NOTE — Patient Instructions (Addendum)
 Attempted Full PFT Today. Spirometry and  complete attempt at [facility] performed Today.

## 2023-02-10 DIAGNOSIS — I10 Essential (primary) hypertension: Secondary | ICD-10-CM | POA: Diagnosis not present

## 2023-02-10 DIAGNOSIS — M8580 Other specified disorders of bone density and structure, unspecified site: Secondary | ICD-10-CM | POA: Diagnosis not present

## 2023-02-10 DIAGNOSIS — E785 Hyperlipidemia, unspecified: Secondary | ICD-10-CM | POA: Diagnosis not present

## 2023-02-10 DIAGNOSIS — E039 Hypothyroidism, unspecified: Secondary | ICD-10-CM | POA: Diagnosis not present

## 2023-02-10 DIAGNOSIS — K769 Liver disease, unspecified: Secondary | ICD-10-CM | POA: Diagnosis not present

## 2023-02-10 DIAGNOSIS — D649 Anemia, unspecified: Secondary | ICD-10-CM | POA: Diagnosis not present

## 2023-02-10 LAB — LAB REPORT - SCANNED: EGFR: 59.5

## 2023-02-12 ENCOUNTER — Encounter (HOSPITAL_BASED_OUTPATIENT_CLINIC_OR_DEPARTMENT_OTHER): Payer: Medicare PPO

## 2023-02-12 ENCOUNTER — Ambulatory Visit (HOSPITAL_BASED_OUTPATIENT_CLINIC_OR_DEPARTMENT_OTHER): Payer: Medicare PPO | Admitting: Pulmonary Disease

## 2023-02-12 ENCOUNTER — Encounter (HOSPITAL_BASED_OUTPATIENT_CLINIC_OR_DEPARTMENT_OTHER): Payer: Self-pay | Admitting: Pulmonary Disease

## 2023-02-12 VITALS — BP 138/88 | HR 108 | Resp 18 | Ht 63.25 in | Wt 195.3 lb

## 2023-02-12 DIAGNOSIS — J849 Interstitial pulmonary disease, unspecified: Secondary | ICD-10-CM

## 2023-02-12 DIAGNOSIS — G4733 Obstructive sleep apnea (adult) (pediatric): Secondary | ICD-10-CM

## 2023-02-12 NOTE — Patient Instructions (Addendum)
Mild OSA --Counseled on sleep hygiene --Counseled on weight loss/maintenance of healthy weight --Counseled NOT to drive if/when sleepy --Advised patient to wear CPAP for at least 4 hours each night for greater than 70% of the time to avoid the machine being repossessed by insurance.  ILD, unknown etiology --Prior radiation exposure however bilateral involvement not consistent. --PFTs with reduced DLCO however overall normal echocardiogram with no evidence of pulmonary hypertension --Defer autoimmune work-up --Encourage regular aerobic exercise at least three times a week --Reviewed PFTs. Normalized DLCO. No further PFT testing indicated due to normal results, age and prior difficulty tolerating/reaction during DLCO testing

## 2023-02-12 NOTE — Progress Notes (Signed)
Subjective:   PATIENT ID: [name] GENDER: female DOB: 1935/05/15, MRN: [id number]   HPI  Chief Complaint  Patient presents with   Follow-up    Breathing is good, about the same   Reason for Visit: Follow-up   Ms. Athena Fricke is an 88 year old female with OSA on CPAP, history of right breast cancer s/p lumpectomy and radiation in remission, hypertension, hyperlipidemia, hypothyroidism, reflux who presents for follow-up.  Synopsis: Initially referred by PCP Dr. Waynard Edwards on 01/30/2020 for abnormal CT chest demonstrating extra mucus in the airways and scarring. She reports long standing history of shortness of breath with exertion with short-medium distances. She gets out of breath after walking a flight of stairs. She will need to rest after walking in the grocery store after 10 minutes. She is unsure of when her symptoms began but it has been gradually worsening and now associated with a cough. She describes having a non-productive cough. No wheezing. Has not measured oxygen levels at home.  2022 - Diagnosed with ILD with associated restrictive defect on PFTs. Treated for flare in June with prednisone. CT with slight progression of GGO.  12/26/20 Overall, she feels stable. No limitations in activity. Denies shortness of breath, cough or wheezing. She had an echocardiogram for murmur. She is compliant with her CPAP nightly 6-8 hours which she reports benefit in sleep quality.   04/01/21 Since her last visit she denies shortness of breath, cough or wheezing. She compliant with CPAP nightly and reports her sleep has improved. She is taking Ozempic for her weight loss and has lost 7 lbs and noticing that she is fitting her clothing better.  10/01/21 Since our last visit, she denies SOB, cough or wheezing. Compliant with CPAP nightly and reports benefit in sleep quality. Completed PFTs and awaiting results.  04/15/22 Since our last visit she reports she is overall doing well. She is  compliant with CPAP nightly. Sometimes exercises by walking 1-2 times a week. Denies shortness of breath, cough and wheezing. Planning for [employer] for her 87th birthday this summer.  10/21/22 Since our last visit overall doing well. She is compliant with CPAP. She has been slowly weaning herself off Temazepam. Still sleeping well. Has unchanged shortness of breath with exertion but this does not prevent her from accomplishing activities. Denies cough or wheezing.  02/12/23 Since our last visit she reports breathing is unchanged. Some minimal cough and wheezing. Sleeping well with CPAP and tolerating machine. Difficulty tolerating DLCO x 2 during PFTs with inspiratory stridor  Social History: Former smoker. Quit in 1972. Smoked 1/2 in her 23s beginning in college. 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 Had to evacuate one room due to asbestos Husband hospitalized in 09/2020 for cardiac cath  Environmental exposures:  Radiation  Past Medical History:  Diagnosis Date   Breast cancer (HCC)    GERD (gastroesophageal reflux disease)    Hyperlipidemia    Hypothyroidism    Indigestion    S/P radiation therapy 06/14/14-07/12/14   right breast 50Gy total dose   Sleep apnea    CPAP   Syncope and collapse 2006   Wears glasses    Wears partial dentures     Allergies  Allergen Reactions   Albuterol Other (See Comments)    Bronchospasms   Lansoprazole     Other reaction(s): tongue burning   Latex Hives and Swelling   Omeprazole     Other reaction(s): doesn't work   Ropinirole     Other reaction(s): terrible side effects.   Semaglutide     Other reaction(s): nausea, gerd and bowel issues   Shellfish-Derived Products Nausea And Vomiting   Simvastatin-High Dose Other (See Comments)    Muscle aches, flu like  symptoms.      Outpatient Medications Prior to Visit  Medication Sig Dispense Refill   acetaminophen (TYLENOL) 325 MG tablet Take 650 mg by mouth every 6 (six) hours as needed.     atorvastatin (LIPITOR) 10 MG tablet Take 10 mg by mouth daily.  1   cholecalciferol (VITAMIN D) 400 UNITS TABS Take 400 Units by mouth daily.     doxepin (SINEQUAN) 50 MG capsule Take 50 mg daily by mouth.     Levothyroxine Sodium (SYNTHROID PO) Take 150 mcg by mouth daily.      Magnesium 200 MG TABS Take 1,000 mg daily by mouth.      Multiple Vitamin (MULTIVITAMIN) tablet Take 1 tablet by mouth daily.     omeprazole (PRILOSEC) 20 MG capsule Take 1 capsule by mouth daily.     rOPINIRole (REQUIP) 0.5 MG tablet Take 0.5 mg by mouth daily.  0   telmisartan (MICARDIS) 40 MG tablet Take 40 mg by mouth daily.     No facility-administered medications prior to visit.    Review of Systems  Constitutional:  Negative for chills, diaphoresis, fever, malaise/fatigue and weight loss.  HENT:  Negative for congestion.   Respiratory:  Positive for cough and wheezing. Negative for hemoptysis, sputum production and shortness of breath.   Cardiovascular:  Negative for chest pain, palpitations and leg swelling.     Objective:   Vitals:   02/12/23 1441  BP: 138/88  Pulse: (!) 108  Resp: 18  SpO2: 91%  Weight: 195 lb 4.8 oz (88.6 kg)  Height: 5' 3.25" (1.607 m)   SpO2: 91 %  Physical Exam: General: Well-appearing, no acute distress HENT: [place], AT Eyes: EOMI, no scleral icterus Respiratory: Clear to auscultation bilaterally.  No crackles, wheezing or rales Cardiovascular: RRR, -M/R/G, no JVD Extremities:-Edema,-tenderness Neuro: AAO x4, CNII-XII grossly intact Psych: Normal mood, normal affect  Data Reviewed:  Imaging: CT chest HR 02/21/2020-basilar and subpleural groundglass with minimal reticulation and single-layer honeycombing notable in the right middle lobe.  Mosaic attenuation.  Traction bronchiectasis  present.  Enlarged PA suggestive of pulmonary arterial hypertension. CT Chest 10/26/20 - Slight progression of GGO with minimal reticulation and septal thickening. Possible early honeycombing  CT Chest 09/19/21 - unchanged GGO, septal thickening and reticulation, mild bronchiectasis CT A/P 08/28/22 - Reviewed lower lobe lung fields. Unchanged septal thickening and mild bronchiectasis and GGO and honeycombing as noted one year ago  PFT: 06/28/20 FVC 2.15 (91%) FEV1 1.57 (90%) Ratio 86  TLC 72% DLCO 93% Interpretation: Mild restrictive defect. Normal DLCO  10/01/21 FVC 2.07 (89%) FEV1 1.78 (104%) Ratio 84  TLC Unable to obtain d/t claustrophobia DLCO 79% Interpretation: DLCO compared to 06/2020 is reduced from 93 to 79%  02/12/23 FVC 2.09 (90%) FEV1 1.68 (99%) Ratio 80  DLCO 89% Interpretation: No spirometry. Normalized DLCO on single DLCO attempt.  Sleep test 08/21/20 HST - AHI 8.7 SpO2 nadir 83%. Recommend auto CPAP 5-15 cm H20  Labs: [employer] labs Basic metabolic panel reviewed.  12/07/2019 Sodium 129, glucose 131.  Remaining values within normal limits CBC reviewed 10/25/2019 WBC 6.35 Absolute eosinophils 100  Echocardiogram: 11/12/20 - EF 63%. Indeterminate mitral regurg, grade I DD  02/14/22-04/14/22 Days usage 60/60 days 100% >4 hours 100% Auto PAP 5-15 cm H20 AHI 0.3  01/12/23-02/10/23 30/30 days (100%) >4 hours 29 days (97%) AutoCPAP 5-15 cm H20 AHI 0.3    Assessment & Plan:   Discussion: 88 year old female with OSA on CAPA, hx of right breast cancer s/p lumpectomy and radiation in remission who presents for follow-up for ILD and OSA. Overall minimal symptoms except for mild DOE. Not on bronchodilators due to ineffectiveness and side effects (failed Breo). Reviewed CT A/P lung fields with unchanged ILD findings.  88 year old female with OSA on CPAP, hx of right breast cancer s/p lumpectomy and radiation in remission who presents for follow-up for ILD and OSA.  Overall unchanged symptoms which are mild. Not on bronchodilators due to ineffectiveness and side effects (failed Breo). Reviewed PFTs with now normalized DLCO in setting of ILD which is likely post-scarring radiation.  Mild OSA The natural history, progression and prognosis of sleep apnea, treatment with PAP and alternative treatment strategies were discussed. The patient was also educated regarding the long term cardiovascular benefits of treating sleep apnea, including improved blood pressure control, reduction in MI and stroke risk as well as other potential benefits of treatment, such as improved glycemic control, facilitation of weight loss, improved energy during the day and improved sleep quality. --Patient uses NIV for more than four hours nightly for at least 70% of nights during the last three months of usage. The patient has been using and benefiting from PAP use and will continue to benefit from therapy.  --Counseled on sleep hygiene --Counseled on weight loss/maintenance of healthy weight --Counseled NOT to drive if/when sleepy --Advised patient to wear CPAP for at least 4 hours each night for greater than 70% of the time to avoid the machine being repossessed by insurance.  ILD, unknown etiology --Prior radiation exposure however bilateral involvement not consistent. --PFTs with reduced DLCO however overall normal echocardiogram with no evidence of pulmonary hypertension --Defer autoimmune work-up --Encourage regular aerobic exercise at least three times a week --Reviewed PFTs. Normalized DLCO. No further PFT testing indicated due to normal results, age and prior difficulty tolerating/reaction during DLCO testing   Health Maintenance Immunization History  Administered Date(s) Administered   Fluad Quad(high Dose 65+) 10/29/2020   Influenza Split 11/08/2008, 11/20/2009, 11/07/2010, 02/06/2011, 11/18/2011, 05/16/2013, 10/14/2013   Influenza, High Dose Seasonal PF 11/07/2015,  11/11/2016   Influenza, Quadrivalent, Recombinant, Inj, Pf 09/17/2017, 10/28/2018, 11/26/2019, 11/23/2020   Influenza,inj,Quad PF,6+ Mos 11/01/2012, 10/14/2013, 10/28/2014   PFIZER Comirnaty(Gray Top)Covid-19 Tri-Sucrose Vaccine 08/13/2020   PFIZER(Purple Top)SARS-COV-2 Vaccination 02/06/2019, 02/26/2019, 11/01/2019   Pneumococcal Conjugate-13 09/08/2013   Pneumococcal Polysaccharide-23 09/16/2001, 12/11/2008, 02/06/2011   Td 08/29/2002, 02/06/2011, 04/29/2012   Tdap 04/29/2012, 08/17/2012   Zoster Recombinant(Shingrix) 06/29/2017, 08/28/2017   Zoster, Live 02/25/2005, 02/06/2011, 08/28/2017   CT Lung Screen - not  a candidate. Insufficient smoking history  No orders of the defined types were placed in this encounter.  No orders of the defined types were placed in this encounter.  Return in about 8 months (around 10/13/2023).  I have spent a total time of 35-minutes on the day of the appointment including chart review, data review, collecting history, coordinating care and discussing medical diagnosis and plan with the patient/family. Past medical history, allergies, medications were reviewed. Pertinent imaging, labs and tests included in this note have been reviewed and interpreted independently by me.  Avira Tillison Mechele Collin, MD [facility] 02/13/2023 2:16 PM  Office Number [phone]

## 2023-02-17 DIAGNOSIS — Z0000 Encounter for general adult medical examination without abnormal findings: Secondary | ICD-10-CM | POA: Diagnosis not present

## 2023-02-17 DIAGNOSIS — G4700 Insomnia, unspecified: Secondary | ICD-10-CM | POA: Diagnosis not present

## 2023-02-17 DIAGNOSIS — E039 Hypothyroidism, unspecified: Secondary | ICD-10-CM | POA: Diagnosis not present

## 2023-02-17 DIAGNOSIS — J849 Interstitial pulmonary disease, unspecified: Secondary | ICD-10-CM | POA: Diagnosis not present

## 2023-02-17 DIAGNOSIS — C50919 Malignant neoplasm of unspecified site of unspecified female breast: Secondary | ICD-10-CM | POA: Diagnosis not present

## 2023-02-17 DIAGNOSIS — I10 Essential (primary) hypertension: Secondary | ICD-10-CM | POA: Diagnosis not present

## 2023-02-17 DIAGNOSIS — I700 Atherosclerosis of aorta: Secondary | ICD-10-CM | POA: Diagnosis not present

## 2023-02-17 DIAGNOSIS — R413 Other amnesia: Secondary | ICD-10-CM | POA: Diagnosis not present

## 2023-02-21 DIAGNOSIS — G4733 Obstructive sleep apnea (adult) (pediatric): Secondary | ICD-10-CM | POA: Diagnosis not present

## 2023-03-17 DIAGNOSIS — R4181 Age-related cognitive decline: Secondary | ICD-10-CM | POA: Diagnosis not present

## 2023-03-17 DIAGNOSIS — R413 Other amnesia: Secondary | ICD-10-CM | POA: Diagnosis not present

## 2023-04-14 ENCOUNTER — Ambulatory Visit: Payer: Medicare PPO | Admitting: Podiatry

## 2023-04-14 ENCOUNTER — Encounter: Payer: Self-pay | Admitting: Podiatry

## 2023-04-14 VITALS — Ht 63.0 in | Wt 195.3 lb

## 2023-04-14 DIAGNOSIS — M79674 Pain in right toe(s): Secondary | ICD-10-CM | POA: Diagnosis not present

## 2023-04-14 DIAGNOSIS — B351 Tinea unguium: Secondary | ICD-10-CM

## 2023-04-14 DIAGNOSIS — M79675 Pain in left toe(s): Secondary | ICD-10-CM

## 2023-04-19 NOTE — Progress Notes (Signed)
  Subjective:  Patient ID: Sydney Wood, female    DOB: Dec 29, 1935,  MRN: [id number]  88 y.o. female presents painful, elongated thickened toenails x 10 which are symptomatic when wearing enclosed shoe gear. This interferes with his/her daily activities.  Chief Complaint  Patient presents with   Nail Problem    Pt is here for [facility] PCP is Dr Waynard Edwards and LOV was in January.   New problem(s): None   PCP is Rodrigo Ran, MD.  Allergies  Allergen Reactions   Albuterol Other (See Comments)    Bronchospasms   Lansoprazole     Other reaction(s): tongue burning   Latex Hives and Swelling   Omeprazole     Other reaction(s): doesn't work   Ropinirole     Other reaction(s): terrible side effects.   Semaglutide     Other reaction(s): nausea, gerd and bowel issues   Shellfish-Derived Products Nausea And Vomiting   Simvastatin-High Dose Other (See Comments)    Muscle aches, flu like symptoms.     Review of Systems: Negative except as noted in the HPI.   Objective:  Sydney Wood is a pleasant 88 y.o. female in NAD. AAO x 3.  Vascular Examination: Vascular status intact b/l with palpable pedal pulses. CFT immediate b/l. Pedal hair present. No edema. No pain with calf compression b/l. Skin temperature gradient WNL b/l. No varicosities noted. No cyanosis or clubbing noted.  Neurological Examination: Sensation grossly intact b/l with 10 gram monofilament. Vibratory sensation intact b/l.  Dermatological Examination: Pedal skin with normal turgor, texture and tone b/l. No open wounds nor interdigital macerations noted. Toenails 1-5 b/l thick, discolored, elongated with subungual debris and pain on dorsal palpation. No hyperkeratotic lesions noted b/l.   Musculoskeletal Examination: Muscle strength 5/5 to b/l LE.  No pain, crepitus noted b/l. Hammertoe(s) bilateral 5th toes.  Radiographs: None  Last A1c:       No data to display           Assessment:   1. Pain due to  onychomycosis of toenails of both feet    Plan:  Patient was evaluated and treated. All patient's and/or POA's questions/concerns addressed on today's visit. Mycotic toenails 1-5 debrided in length and girth without incident. Continue soft, supportive shoe gear daily. Report any pedal injuries to medical professional. Call office if there are any quesitons/concerns. -Patient/POA to call should there be question/concern in the interim.  Return in about 3 months (around 07/15/2023).  Freddie Breech, DPM      [facility] LOCATION: [address] [place] [address]                   Office [phone]   [facility] LOCATION: [address] [place], [place] [address] Office [phone]

## 2023-04-24 DIAGNOSIS — H25043 Posterior subcapsular polar age-related cataract, bilateral: Secondary | ICD-10-CM | POA: Diagnosis not present

## 2023-04-24 DIAGNOSIS — H2513 Age-related nuclear cataract, bilateral: Secondary | ICD-10-CM | POA: Diagnosis not present

## 2023-04-24 DIAGNOSIS — H18413 Arcus senilis, bilateral: Secondary | ICD-10-CM | POA: Diagnosis not present

## 2023-04-24 DIAGNOSIS — H2512 Age-related nuclear cataract, left eye: Secondary | ICD-10-CM | POA: Diagnosis not present

## 2023-04-24 DIAGNOSIS — H25013 Cortical age-related cataract, bilateral: Secondary | ICD-10-CM | POA: Diagnosis not present

## 2023-04-27 DIAGNOSIS — H25043 Posterior subcapsular polar age-related cataract, bilateral: Secondary | ICD-10-CM | POA: Diagnosis not present

## 2023-04-27 DIAGNOSIS — H2512 Age-related nuclear cataract, left eye: Secondary | ICD-10-CM | POA: Diagnosis not present

## 2023-04-27 DIAGNOSIS — H25013 Cortical age-related cataract, bilateral: Secondary | ICD-10-CM | POA: Diagnosis not present

## 2023-04-27 DIAGNOSIS — H18413 Arcus senilis, bilateral: Secondary | ICD-10-CM | POA: Diagnosis not present

## 2023-04-27 DIAGNOSIS — H2513 Age-related nuclear cataract, bilateral: Secondary | ICD-10-CM | POA: Diagnosis not present

## 2023-06-29 ENCOUNTER — Encounter: Payer: Self-pay | Admitting: Cardiovascular Disease

## 2023-06-29 NOTE — Progress Notes (Signed)
 Sydney Wood Date of Birth  12-Dec-1935 [facility] [address]    [address] [place], [place]  [address]      Problem List 1. Hypertension 2. Hyperlipidemia 3. Sleep apnea 4. Breast Cancer    88 yo with a hx of presyncope, hyperlipidemia, sleep apnea.  She has been under a lot of stress recently. Her sister was diagnosed with stage III inflammatory breast cancer. She has been going to [facility] a regular basis to help her sister with chemotherapy and other related issues.  She denies any chest pain, shortness breath, syncope, or presyncope.  She has a history of hypercholesterolemia. Her cholesterol levels are followed by Dr. Genelle Kennedy.  She brought her blood pressure log with her today. All of her readings are acceptable.  She brought labs were drawn at Dr. Erving Heather his office. All of her numbers are quite good. Her a protein B. level is 67 which is quite good. Her total cholesterol is 150. The triglyceride level is 129. The HDL is 42. Her LDL is 82.  She's not had any episodes of chest pain or shortness breath.  Nov. 17, 2014: Feeling well.  BP at home is usually normal.  Still traveling lots - helping her sister with breast cancer.  Exercising a bit. tries to walk in the neighborhood.   Nov. 24, 2015:  Sydney Wood is doing well.  Tries to exercise.  BP readings are ok No CP or dyspnea.   Nov. 28, 2016:    Has been diagnosed with right breast breast cancer ( DCIS, stage 0)  Had lumpectomy and 20 XRT treatments. Has had left shoulder problems  Was also found to have Meralgia Paresthetica  - affects her sciatic nerve and left leg pain and tingling .  Is now getting back some energy   April 22, 2016:  Doing well.   BP has been a little elevated   Feeling well  Labs from [facility] look good.  Her total cholesterol is 133.  The triglyceride level is 94.  HDL level is 41.  The LDL is 73.  Nov. 15, 2018:    Doing ok We started HTCZ last visit.    Thinks her vision is not as good ,  More blurred vision Walks on occasion  July 21, 2017: Doing well . Taking her BP at home  -  BP is well controlled at home  No CP or dyspnea.  Lipids managed by Dr. Genelle Kennedy  Is on Telmasartan - tolerating it well   Oct. 1, 2020   Sydney Wood is seen today for follow-up of her hypertension and hyperlipidemia.  Recently saw Dr. Genelle Kennedy,  BP was up so he increased the Telmasartan  Feeling well,  Not exercising much Her TSH was low so her synthroid dose was reduced.  ( which may explain her HR being a bit high)   Oct. 4, 2021: Sydney Wood is seen today for follow up of her HTN and HLD  BP is normal at home  No CP or dyspnea Has some DOE,  Dose not exercise .   She tries to walk on occasion  Sees Dr. Genelle Kennedy for her lipid management.   Oct. 3, 2022 Sydney Wood is seen today for follow up of her HTN and HLD  Has developed Idopathic pulmonary fibrosis Possibly caused by radiation from her breast radiation Is seeing pulmonary  .  Dr. [place] Hacker may want a right heart cath if her symptoms worsen.  Labs from Dr. Tempie Fee office  from September, 2021 show a total cholesterol of 140, triglyceride level is 126, HDL is 43, LDL is 72. Sodium level is 129.  Her HCTZ was discontinued following this lab result.  Potassium is normal.  Liver functions are normal.  Creatinine 0.09 TSH was 0.16 which is normal  May 31, 2021: Sydney Wood is seen today for follow-up of her hypertension, hyperlipidemia.  She also has a history of idiopathic pulmonary fibrosis, possibly caused by radiation from her right breast cancer. Her pulmonologist has considered may be a right heart catheterization if her symptoms worsen. She has hyperlipidemia which has been managed by Dr. Tempie Fee office.   Breathing seems to be stable   No Cp ,  Chronic dyspnea   Echo from Oct. 2022 shows normal LV function , mild - moderate MR .   She did not tolerate Ozempic.  It caused too many side effects.   Jun 03, 2022  Sydney Wood is seen for follow up of her HTN, HLD , idopathic pulmonary fibrosis Is less active than she was last year  Doing ok with her IPF .   Labs from Dr. Genelle Kennedy Oct. 2023  Chol = 149 Trigs = 93 HLD = 50 LDL = 80      June 30, 2023 Sydney Wood is seen for follow up of her HTN, HLD , idopathic pulmonary fibrosis  Is having cataract surgery    Labs from Dr. Genelle Kennedy from 02/10/23  Chol = 149 Trigs = 120 HDL = 53 LDL = 72    She wants to follow up at [facility]   Breathing is good    Current Outpatient Medications on File Prior to Visit  Medication Sig Dispense Refill   acetaminophen  (TYLENOL ) 325 MG tablet Take 650 mg by mouth every 6 (six) hours as needed.     amLODipine (NORVASC) 2.5 MG tablet Take 2.5 mg by mouth daily.     atorvastatin (LIPITOR) 10 MG tablet Take 10 mg by mouth daily.  1   cholecalciferol (VITAMIN D) 400 UNITS TABS Take 400 Units by mouth daily.     doxepin (SINEQUAN) 50 MG capsule Take 50 mg daily by mouth.     Levothyroxine Sodium (SYNTHROID PO) Take 150 mcg by mouth daily.      Magnesium 200 MG TABS Take 1,000 mg daily by mouth.      Multiple Vitamin (MULTIVITAMIN) tablet Take 1 tablet by mouth daily.     omeprazole (PRILOSEC) 20 MG capsule Take 1 capsule by mouth daily.     rOPINIRole (REQUIP) 0.5 MG tablet Take 0.5 mg by mouth daily.  0   telmisartan  (MICARDIS ) 40 MG tablet Take 40 mg by mouth daily.     zolpidem (AMBIEN) 5 MG tablet Take 5 mg by mouth at bedtime as needed.     No current facility-administered medications on file prior to visit.    Allergies  Allergen Reactions   Albuterol Other (See Comments)    Bronchospasms   Lansoprazole     Other reaction(s): tongue burning   Latex Hives and Swelling   Omeprazole     Other reaction(s): doesn't work   Ropinirole     Other reaction(s): terrible side effects.   Semaglutide     Other reaction(s): nausea, gerd and bowel issues   Shellfish-Derived Products Nausea And Vomiting    Simvastatin -High Dose Other (See Comments)    Muscle aches, flu like symptoms.     Past Medical History:  Diagnosis Date   Breast cancer (HCC)  GERD (gastroesophageal reflux disease)    Hyperlipidemia    Hypothyroidism    Indigestion    S/P radiation therapy 06/14/14-07/12/14   right breast 50Gy total dose   Sleep apnea    CPAP   Syncope and collapse 2006   Wears glasses    Wears partial dentures     Past Surgical History:  Procedure Laterality Date   BREAST LUMPECTOMY WITH RADIOACTIVE SEED LOCALIZATION Right 05/18/2014   Procedure: BREAST LUMPECTOMY WITH RADIOACTIVE SEED LOCALIZATION;  Surgeon: Enid Harry, MD;  Location: [facility];  Service: General;  Laterality: Right;   CARDIOVASCULAR STRESS TEST  12/17/2004   EF 73%. NO EVIDENCE OF ISCHEMIA   CARPAL TUNNEL RELEASE     LEFT HAND   COLONOSCOPY     DILATION AND CURETTAGE OF UTERUS     x2   TUBAL LIGATION     US  ECHOCARDIOGRAPHY  12/12/2004   EF 55-60%    Social History   Tobacco Use  Smoking Status Former   Current packs/day: 0.00   Average packs/day: 0.5 packs/day for 15.0 years (7.5 ttl pk-yrs)   Types: Cigarettes   Start date: 01/28/1955   Quit date: 01/27/1970   Years since quitting: 53.4  Smokeless Tobacco Never    Social History   Substance and Sexual Activity  Alcohol  Use No    Family History  Problem Relation Age of Onset   Coronary artery disease Father    Heart attack Father    Breast cancer Sister    Lymphoma Brother    Breast cancer Paternal Grandmother    Colon cancer Neg Hx    Stomach cancer Neg Hx     Reviw of Systems:  Noted in current history, otherwise review of systems is negative.   Physical Exam: Blood pressure (!) 147/87, pulse 94, height 5\' 3"  (1.6 m), weight 197 lb (89.4 kg), SpO2 93%.   GEN:  Well nourished, well developed in no acute distress HEENT: Normal NECK: No JVD; No carotid bruits LYMPHATICS: No lymphadenopathy CARDIAC: RRR , no murmurs,  rubs, gallops RESPIRATORY:  Clear to auscultation without rales, wheezing or rhonchi  ABDOMEN: Soft, non-tender, non-distended MUSCULOSKELETAL:  No edema; No deformity  SKIN: Warm and dry NEUROLOGIC:  Alert and oriented x 3  ECG:      EKG Interpretation Date/Time:  Tuesday June 30 2023 15:27:11 EDT Ventricular Rate:  94 PR Interval:  154 QRS Duration:  84 QT Interval:  352 QTC Calculation: 440 R Axis:   49  Text Interpretation: Normal sinus rhythm Nonspecific T wave abnormality When compared with ECG of 02-Dec-2016 09:19, No significant change since last tracing Confirmed by Ahmad Alert ([id number]) on 06/30/2023 3:53:24 PM    Assessment / Plan:   1. Hypertension -    BP at home is generally well controlled.  Her home BP log shows normal readings    She is at low risk for her upcoming cataract surgery    2. Hyperlipidemia-  lipids maganged by Dr. Genelle Kennedy   3.  Interstitial lung disease:  Per pulmonary       Ahmad Alert, MD  06/30/2023 3:53 PM    [facility] [address] [place],  [address] [place], [place]  [address] Pager [phone] Phone: [phone]; Fax: [phone]

## 2023-06-30 ENCOUNTER — Ambulatory Visit: Attending: Cardiovascular Disease | Admitting: Cardiovascular Disease

## 2023-06-30 ENCOUNTER — Encounter: Payer: Self-pay | Admitting: Cardiovascular Disease

## 2023-06-30 VITALS — BP 147/87 | HR 94 | Ht 63.0 in | Wt 197.0 lb

## 2023-06-30 DIAGNOSIS — I10 Essential (primary) hypertension: Secondary | ICD-10-CM

## 2023-06-30 DIAGNOSIS — E782 Mixed hyperlipidemia: Secondary | ICD-10-CM

## 2023-06-30 NOTE — Patient Instructions (Signed)
 Follow-Up: At [facility], you and your health needs are our priority.  As part of our continuing mission to provide you with exceptional heart care, our providers are all part of one team.  This team includes your primary Cardiologist (physician) and Advanced Practice Providers or APPs (Physician Assistants and Nurse Practitioners) who all work together to provide you with the care you need, when you need it.  Your next appointment:   1 year(s)  Provider:   Sheryle Donning, MD, Jackquelyn Mass, MD, Maudine Sos, MD, Carson Clara, MD, Slater Duncan, NP, or Neomi Banks, NP

## 2023-07-13 DIAGNOSIS — H2512 Age-related nuclear cataract, left eye: Secondary | ICD-10-CM | POA: Diagnosis not present

## 2023-07-14 DIAGNOSIS — H2511 Age-related nuclear cataract, right eye: Secondary | ICD-10-CM | POA: Diagnosis not present

## 2023-07-21 ENCOUNTER — Ambulatory Visit (INDEPENDENT_AMBULATORY_CARE_PROVIDER_SITE_OTHER): Payer: Medicare PPO | Admitting: Podiatry

## 2023-07-21 DIAGNOSIS — Z91198 Patient's noncompliance with other medical treatment and regimen for other reason: Secondary | ICD-10-CM

## 2023-07-24 NOTE — Progress Notes (Signed)
 1. Failure to attend appointment with reason given    Appointment canceled by provider.

## 2023-08-03 DIAGNOSIS — H2511 Age-related nuclear cataract, right eye: Secondary | ICD-10-CM | POA: Diagnosis not present

## 2023-08-03 DIAGNOSIS — H25011 Cortical age-related cataract, right eye: Secondary | ICD-10-CM | POA: Diagnosis not present

## 2023-08-03 DIAGNOSIS — H25041 Posterior subcapsular polar age-related cataract, right eye: Secondary | ICD-10-CM | POA: Diagnosis not present

## 2023-08-10 DIAGNOSIS — L814 Other melanin hyperpigmentation: Secondary | ICD-10-CM | POA: Diagnosis not present

## 2023-08-10 DIAGNOSIS — L720 Epidermal cyst: Secondary | ICD-10-CM | POA: Diagnosis not present

## 2023-08-10 DIAGNOSIS — D1801 Hemangioma of skin and subcutaneous tissue: Secondary | ICD-10-CM | POA: Diagnosis not present

## 2023-08-10 DIAGNOSIS — D692 Other nonthrombocytopenic purpura: Secondary | ICD-10-CM | POA: Diagnosis not present

## 2023-08-10 DIAGNOSIS — L820 Inflamed seborrheic keratosis: Secondary | ICD-10-CM | POA: Diagnosis not present

## 2023-08-10 DIAGNOSIS — L821 Other seborrheic keratosis: Secondary | ICD-10-CM | POA: Diagnosis not present

## 2023-08-10 DIAGNOSIS — L570 Actinic keratosis: Secondary | ICD-10-CM | POA: Diagnosis not present

## 2023-08-13 DIAGNOSIS — Z1231 Encounter for screening mammogram for malignant neoplasm of breast: Secondary | ICD-10-CM | POA: Diagnosis not present

## 2023-09-10 DIAGNOSIS — G5601 Carpal tunnel syndrome, right upper limb: Secondary | ICD-10-CM | POA: Diagnosis not present

## 2023-09-24 DIAGNOSIS — R413 Other amnesia: Secondary | ICD-10-CM | POA: Diagnosis not present

## 2023-09-24 DIAGNOSIS — E039 Hypothyroidism, unspecified: Secondary | ICD-10-CM | POA: Diagnosis not present

## 2023-09-24 DIAGNOSIS — R4181 Age-related cognitive decline: Secondary | ICD-10-CM | POA: Diagnosis not present

## 2023-09-24 DIAGNOSIS — I700 Atherosclerosis of aorta: Secondary | ICD-10-CM | POA: Diagnosis not present

## 2023-09-24 DIAGNOSIS — J849 Interstitial pulmonary disease, unspecified: Secondary | ICD-10-CM | POA: Diagnosis not present

## 2023-09-24 DIAGNOSIS — Z23 Encounter for immunization: Secondary | ICD-10-CM | POA: Diagnosis not present

## 2023-09-24 DIAGNOSIS — E785 Hyperlipidemia, unspecified: Secondary | ICD-10-CM | POA: Diagnosis not present

## 2023-09-24 DIAGNOSIS — R7301 Impaired fasting glucose: Secondary | ICD-10-CM | POA: Diagnosis not present

## 2023-09-24 DIAGNOSIS — D649 Anemia, unspecified: Secondary | ICD-10-CM | POA: Diagnosis not present

## 2023-09-24 DIAGNOSIS — I340 Nonrheumatic mitral (valve) insufficiency: Secondary | ICD-10-CM | POA: Diagnosis not present

## 2023-09-24 DIAGNOSIS — I10 Essential (primary) hypertension: Secondary | ICD-10-CM | POA: Diagnosis not present

## 2023-10-06 DIAGNOSIS — G4733 Obstructive sleep apnea (adult) (pediatric): Secondary | ICD-10-CM | POA: Diagnosis not present

## 2023-10-20 ENCOUNTER — Encounter: Payer: Self-pay | Admitting: Podiatry

## 2023-10-20 ENCOUNTER — Ambulatory Visit: Admitting: Podiatry

## 2023-10-20 VITALS — Ht 63.0 in | Wt 197.0 lb

## 2023-10-20 DIAGNOSIS — M79674 Pain in right toe(s): Secondary | ICD-10-CM | POA: Diagnosis not present

## 2023-10-20 DIAGNOSIS — M79675 Pain in left toe(s): Secondary | ICD-10-CM | POA: Diagnosis not present

## 2023-10-20 DIAGNOSIS — B351 Tinea unguium: Secondary | ICD-10-CM

## 2023-10-21 NOTE — Progress Notes (Signed)
  Subjective:  Patient ID: Sydney Wood, female    DOB: [birth year],  MRN: [id number]  Sydney Wood presents to clinic today for: painful mycotic toenails of both feet that are difficult to trim. Pain interferes with daily activities and wearing enclosed shoe gear comfortably.  Chief Complaint  Patient presents with   Nail Problem    Rm 16 RFC. Pt is not diabetic. PCP-Dr. Oneil Neth, last visit 07/2023   PCP is Neth Oneil, MD.  Allergies  Allergen Reactions   Albuterol Other (See Comments)    Bronchospasms   Lansoprazole     Other reaction(s): tongue burning   Latex Hives and Swelling   Omeprazole     Other reaction(s): doesn't work   Ropinirole     Other reaction(s): terrible side effects.   Semaglutide     Other reaction(s): nausea, gerd and bowel issues   Shellfish-Derived Products Nausea And Vomiting   Simvastatin -High Dose Other (See Comments)    Muscle aches, flu like symptoms.     Review of Systems: Negative except as noted in the HPI.  Objective: No changes noted in today's physical examination. There were no vitals filed for this visit.  Sydney Wood is a pleasant 88 y.o. female in NAD. AAO x 3.  Vascular Examination: Capillary refill time <3 seconds b/l LE. Palpable pedal pulses b/l LE. Digital hair present b/l. No pedal edema b/l. Skin temperature gradient WNL b/l. No varicosities b/l. No cyanosis or clubbing. No ischemia or gangrene. .  Dermatological Examination: Pedal skin with normal turgor, texture and tone b/l. No open wounds. No interdigital macerations b/l. Toenails 1-5 b/l thickened, discolored, dystrophic with subungual debris. There is pain on palpation to dorsal aspect of nailplates. No corns, calluses, nor porokeratotic lesions.  Neurological Examination: Protective sensation intact with 10 gram monofilament b/l LE. Vibratory sensation intact b/l LE.   Musculoskeletal Examination: Muscle strength 5/5 to all lower extremity muscle  groups bilaterally. Hammertoe(s) b/l 5th toes.  Assessment/Plan: 1. Pain due to onychomycosis of toenails of both feet   Consent given for treatment. Patient examined. All patient's and/or POA's questions/concerns addressed on today's visit. Toenails 1-5 debrided in length and girth without incident. Continue soft, supportive shoe gear daily. Report any pedal injuries to medical professional. Call office if there are any questions/concerns. -Patient/POA to call should there be question/concern in the interim.   Return in about 3 months (around 01/19/2024).  Delon LITTIE Merlin, DPM      [facility] LOCATION: [address], [place] [address]                   Office [phone]   [facility] LOCATION: [address] [place], [place] [address] Office [phone]

## 2023-10-26 DIAGNOSIS — L821 Other seborrheic keratosis: Secondary | ICD-10-CM | POA: Diagnosis not present

## 2023-10-26 DIAGNOSIS — L578 Other skin changes due to chronic exposure to nonionizing radiation: Secondary | ICD-10-CM | POA: Diagnosis not present

## 2023-10-26 DIAGNOSIS — L820 Inflamed seborrheic keratosis: Secondary | ICD-10-CM | POA: Diagnosis not present

## 2023-10-26 DIAGNOSIS — L738 Other specified follicular disorders: Secondary | ICD-10-CM | POA: Diagnosis not present

## 2023-10-26 DIAGNOSIS — L814 Other melanin hyperpigmentation: Secondary | ICD-10-CM | POA: Diagnosis not present

## 2023-10-27 ENCOUNTER — Encounter (HOSPITAL_BASED_OUTPATIENT_CLINIC_OR_DEPARTMENT_OTHER): Payer: Self-pay | Admitting: Pulmonary Disease

## 2023-10-27 ENCOUNTER — Ambulatory Visit (HOSPITAL_BASED_OUTPATIENT_CLINIC_OR_DEPARTMENT_OTHER): Admitting: Pulmonary Disease

## 2023-10-27 VITALS — BP 135/71 | HR 96 | Ht 63.0 in | Wt 195.1 lb

## 2023-10-27 DIAGNOSIS — J849 Interstitial pulmonary disease, unspecified: Secondary | ICD-10-CM | POA: Diagnosis not present

## 2023-10-27 DIAGNOSIS — G4733 Obstructive sleep apnea (adult) (pediatric): Secondary | ICD-10-CM

## 2023-10-27 NOTE — Progress Notes (Signed)
 Subjective:   PATIENT ID: Sydney Wood GENDER: female DOB: 1935-04-06, MRN: [id number]   HPI  Chief Complaint  Patient presents with   Interstitial Lung Disease   Reason for Visit: Follow-up   Sydney Wood is an 88 year old female with OSA on CPAP, history of right breast cancer s/p lumpectomy and radiation in remission, hypertension, hyperlipidemia, hypothyroidism, reflux who presents for follow-up.  Synopsis: Initially referred by PCP Dr. Shayne on 01/30/2020 for abnormal CT chest demonstrating extra mucus in the airways and scarring. She reports long standing history of shortness of breath with exertion with short-medium distances. She gets out of breath after walking a flight of stairs. She will need to rest after walking in the grocery store after 10 minutes. She is unsure of when her symptoms began but it has been gradually worsening and now associated with a cough. She describes having a non-productive cough. No wheezing. Has not measured oxygen levels at home.  2022 - Diagnosed with ILD with associated restrictive defect on PFTs. Treated for flare in June with prednisone. CT with slight progression of GGO.  12/26/20 Overall, she feels stable. No limitations in activity. Denies shortness of breath, cough or wheezing. She had an echocardiogram for murmur. She is compliant with her CPAP nightly 6-8 hours which she reports benefit in sleep quality.   04/01/21 Since her last visit she denies shortness of breath, cough or wheezing. She compliant with CPAP nightly and reports her sleep has improved. She is taking Ozempic for her weight loss and has lost 7 lbs and noticing that she is fitting her clothing better.  10/01/21 Since our last visit, she denies SOB, cough or wheezing. Compliant with CPAP nightly and reports benefit in sleep quality. Completed PFTs and awaiting results.  04/15/22 Since our last visit she reports she is overall doing well. She is compliant with CPAP  nightly. Sometimes exercises by walking 1-2 times a week. Denies shortness of breath, cough and wheezing. Planning for [employer] for her 87th birthday this summer.  10/21/22 Since our last visit overall doing well. She is compliant with CPAP. She has been slowly weaning herself off Temazepam. Still sleeping well. Has unchanged shortness of breath with exertion but this does not prevent her from accomplishing activities. Denies cough or wheezing.  02/12/23 Since our last visit she reports breathing is unchanged. Some minimal cough and wheezing. Sleeping well with CPAP and tolerating machine. Difficulty tolerating DLCO x 2 during PFTs with inspiratory stridor  10/27/23 Since our last visit she reports she is overall doing well. No respiratory symptoms. Denied shortness of breath, cough or wheezing. She is compliant with her CPAP and has updated all her hardware.  Social History: Former smoker. Quit in 1972. Smoked 1/2 in her 102s beginning in college. 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 Had to evacuate one room due to asbestos Husband hospitalized in 09/2020 for cardiac cath  Environmental exposures:  Radiation  Past Medical History:  Diagnosis Date   Breast cancer (HCC)    GERD (gastroesophageal reflux disease)    Hyperlipidemia    Hypothyroidism    Indigestion    S/P radiation therapy 06/14/14-07/12/14   right breast 50Gy total dose   Sleep apnea    CPAP   Syncope and collapse 2006   Wears glasses    Wears partial dentures     Allergies  Allergen Reactions   Albuterol Other (See Comments)    Bronchospasms   Lansoprazole     Other reaction(s): tongue burning   Latex Hives and Swelling   Omeprazole     Other reaction(s): doesn't work   Ropinirole     Other reaction(s): terrible side effects.    Semaglutide     Other reaction(s): nausea, gerd and bowel issues   Shellfish-Derived Products Nausea And Vomiting   Simvastatin -High Dose Other (See Comments)    Muscle aches, flu like symptoms.      Outpatient Medications Prior to Visit  Medication Sig Dispense Refill   acetaminophen  (TYLENOL ) 325 MG tablet Take 650 mg by mouth every 6 (six) hours as needed.     amLODipine (NORVASC) 2.5 MG tablet Take 2.5 mg by mouth daily.     atorvastatin (LIPITOR) 10 MG tablet Take 10 mg by mouth daily.  1   cholecalciferol (VITAMIN D) 400 UNITS TABS Take 400 Units by mouth daily.     doxepin (SINEQUAN) 50 MG capsule Take 50 mg daily by mouth.     Levothyroxine Sodium (SYNTHROID PO) Take 150 mcg by mouth daily.      Magnesium 200 MG TABS Take 1,000 mg daily by mouth.      Multiple Vitamin (MULTIVITAMIN) tablet Take 1 tablet by mouth daily.     omeprazole (PRILOSEC) 20 MG capsule Take 1 capsule by mouth daily.     rOPINIRole (REQUIP) 0.5 MG tablet Take 0.5 mg by mouth daily.  0   telmisartan  (MICARDIS ) 40 MG tablet Take 40 mg by mouth daily.     zolpidem (AMBIEN) 5 MG tablet Take 5 mg by mouth at bedtime as needed.     No facility-administered medications prior to visit.    Review of Systems  Constitutional:  Negative for chills, diaphoresis, fever, malaise/fatigue and weight loss.  HENT:  Negative for congestion.   Respiratory:  Negative for cough, hemoptysis, sputum production, shortness of breath and wheezing.   Cardiovascular:  Negative for chest pain, palpitations and leg swelling.     Objective:   Vitals:   10/27/23 1402  BP: 135/71  Pulse: 96  SpO2: 94%  Weight: 195 lb 1.6 oz (88.5 kg)  Height: 5' 3 (1.6 m)   SpO2: 94 %  Physical Exam: General: Well-appearing, no acute distress HENT: [place], AT Eyes: EOMI, no scleral icterus Respiratory: Clear to auscultation bilaterally.  No crackles, wheezing or rales Cardiovascular: RRR, -M/R/G, no  JVD Extremities:-Edema,-tenderness Neuro: AAO x4, CNII-XII grossly intact Psych: Normal mood, normal affect  Data Reviewed:  Imaging: CT chest HR 02/21/2020-basilar and subpleural groundglass with minimal reticulation and single-layer honeycombing notable in the right middle lobe.  Mosaic attenuation.  Traction bronchiectasis present.  Enlarged PA suggestive of pulmonary arterial hypertension. CT Chest 10/26/20 - Slight progression of GGO with minimal reticulation and septal thickening. Possible early honeycombing  CT Chest 09/19/21 - unchanged GGO, septal thickening and reticulation, mild bronchiectasis CT A/P 08/28/22 - Reviewed lower lobe lung fields. Unchanged septal thickening and mild bronchiectasis and GGO and honeycombing as noted  one year ago  PFT: 06/28/20 FVC 2.15 (91%) FEV1 1.57 (90%) Ratio 86  TLC 72% DLCO 93% Interpretation: Mild restrictive defect. Normal DLCO  10/01/21 FVC 2.07 (89%) FEV1 1.78 (104%) Ratio 84  TLC Unable to obtain d/t claustrophobia DLCO 79% Interpretation: DLCO compared to 06/2020 is reduced from 93 to 79%  02/12/23 FVC 2.09 (90%) FEV1 1.68 (99%) Ratio 80  DLCO 89% Interpretation: No spirometry. Normalized DLCO on single DLCO attempt.  Sleep test 08/21/20 HST - AHI 8.7 SpO2 nadir 83%. Recommend auto CPAP 5-15 cm H20  Labs: [employer] labs Basic metabolic panel reviewed.  12/07/2019 Sodium 129, glucose 131.  Remaining values within normal limits CBC reviewed 10/25/2019 WBC 6.35 Absolute eosinophils 100  Echocardiogram: 11/12/20 - EF 63%. Indeterminate mitral regurg, grade I DD  02/14/22-04/14/22 Days usage 60/60 days 100% >4 hours 100% Auto PAP 5-15 cm H20 AHI 0.3  01/12/23-02/10/23 30/30 days (100%) >4 hours 29 days (97%) AutoCPAP 5-15 cm H20 AHI 0.3  09/26/23-10/25/23 30/30 days (100%) >4 hours 30 days (100%) AutoCPAP 5-15 cm H20 AHI 0.1    Assessment & Plan:   Discussion: 88 year old female with OSA on CPAP, hx right right  breast cancer s/p lumpectomy and radiation in remission who presents for follow-up for ILD and OSA.  Asymptomatic. Not on bronchodilators due to ineffectiveness and side effects (failed Breo). Reviewed OSA CPAP compliance.  Mild OSA --Patient uses NIV for more than four hours nightly for at least 70% of nights during the last three months of usage. The patient has been using and benefiting from PAP use and will continue to benefit from therapy.  --Counseled on sleep hygiene --Counseled on weight loss/maintenance of healthy weight --Counseled NOT to drive if/when sleepy --Advised patient to wear CPAP for at least 4 hours each night for greater than 70% of the time to avoid the machine being repossessed by insurance.  ILD, unknown etiology --Prior radiation exposure however bilateral involvement not consistent. --PFTs with reduced DLCO however overall normal echocardiogram with no evidence of pulmonary hypertension --Defer autoimmune work-up --Encourage regular aerobic exercise at least three times a week --PFTs 01/2023 Normalized DLCO. No further PFT testing indicated due to normal results, age and prior difficulty tolerating/reaction during DLCO testing   Health Maintenance Immunization History  Administered Date(s) Administered   Fluad Quad(high Dose 65+) 10/29/2020   INFLUENZA, HIGH DOSE SEASONAL PF 11/07/2015, 11/11/2016   Influenza Split 11/08/2008, 11/20/2009, 11/07/2010, 02/06/2011, 11/18/2011, 05/16/2013, 10/14/2013   Influenza, Quadrivalent, Recombinant, Inj, Pf 09/17/2017, 10/28/2018, 11/26/2019, 11/23/2020   Influenza,inj,Quad PF,6+ Mos 11/01/2012, 10/14/2013, 10/28/2014   PFIZER Comirnaty (Gray Top)Covid-19 Tri-Sucrose Vaccine 08/13/2020   PFIZER(Purple Top)SARS-COV-2 Vaccination 02/06/2019, 02/26/2019, 11/01/2019   Pneumococcal Conjugate-13 09/08/2013   Pneumococcal Polysaccharide-23 09/16/2001, 12/11/2008, 02/06/2011   Td 08/29/2002, 02/06/2011, 04/29/2012   Tdap 04/29/2012,  08/17/2012   Zoster Recombinant(Shingrix) 06/29/2017, 08/28/2017   Zoster, Live 02/25/2005, 02/06/2011, 08/28/2017   CT Lung Screen - not a candidate. Insufficient smoking history  No orders of the defined types were placed in this encounter.  No orders of the defined types were placed in this encounter.  Return in about 6 months (around 04/25/2024).  I have spent a total time of 35-minutes on the day of the appointment including chart review, data review, collecting history, coordinating care and discussing medical diagnosis and plan with the patient/family. Past medical history, allergies, medications were reviewed. Pertinent imaging, labs and tests included in this note have been reviewed and interpreted independently by me.  Capers Hagmann Slater Staff,  MD [facility] 10/27/2023 2:13 PM

## 2023-10-27 NOTE — Patient Instructions (Signed)
 OSA --Continue CPAP compliance

## 2024-01-12 ENCOUNTER — Ambulatory Visit: Admitting: Podiatry

## 2024-01-12 ENCOUNTER — Encounter: Payer: Self-pay | Admitting: Podiatry

## 2024-01-12 DIAGNOSIS — M79674 Pain in right toe(s): Secondary | ICD-10-CM

## 2024-01-12 DIAGNOSIS — B351 Tinea unguium: Secondary | ICD-10-CM

## 2024-01-12 DIAGNOSIS — M79675 Pain in left toe(s): Secondary | ICD-10-CM

## 2024-01-17 NOTE — Progress Notes (Signed)
"  °  Subjective:  Patient ID: Sydney Wood, female    DOB: 04/17/1935,  MRN: [id number]  Sydney Wood presents to clinic today for painful thick toenails that are difficult to trim. Pain interferes with ambulation. Aggravating factors include wearing enclosed shoe gear. Pain is relieved with periodic professional debridement.  Chief Complaint  Patient presents with   Nail Problem    She trims my nails.   New problem(s): None.   PCP is Shayne Anes, MD.  Allergies[1]  Review of Systems: Negative except as noted in the HPI.  Objective: No changes noted in today's physical examination. There were no vitals filed for this visit. Sydney Wood is a pleasant 88 y.o. female in NAD. AAO x 3.  Vascular Examination: Capillary refill time <3 seconds b/l LE. Palpable pedal pulses b/l LE. Digital hair present b/l. No pedal edema b/l. Skin temperature gradient WNL b/l. No varicosities b/l. No cyanosis or clubbing. No ischemia or gangrene. .  Dermatological Examination: Pedal skin with normal turgor, texture and tone b/l. No open wounds. No interdigital macerations b/l. Toenails 1-5 b/l thickened, discolored, dystrophic with subungual debris. There is pain on palpation to dorsal aspect of nailplates. No corns, calluses, nor porokeratotic lesions.  Neurological Examination: Protective sensation intact with 10 gram monofilament b/l LE. Vibratory sensation intact b/l LE.   Musculoskeletal Examination: Muscle strength 5/5 to all lower extremity muscle groups bilaterally. Hammertoe(s) b/l 5th toes.  Assessment/Plan: 1. Pain due to onychomycosis of toenails of both feet   Patient was evaluated and treated. All patient's and/or POA's questions/concerns addressed on today's visit. Mycotic toenails 1-5 b/l debrided in length and girth without incident. Continue soft, supportive shoe gear daily. Report any pedal injuries to medical professional. Call office if there are any  quesitons/concerns. -Patient/POA to call should there be question/concern in the interim.   Return in about 3 months (around 04/11/2024).  Delon LITTIE Merlin, DPM      [facility] LOCATION: [address] [place], [place] [address]                   Office [phone]   [facility] LOCATION: [address] [place], [place] [address] Office [phone]     [1]  Allergies Allergen Reactions   Albuterol Other (See Comments)    Bronchospasms   Lansoprazole     Other reaction(s): tongue burning   Latex Hives and Swelling   Omeprazole     Other reaction(s): doesn't work   Ropinirole     Other reaction(s): terrible side effects.   Semaglutide     Other reaction(s): nausea, gerd and bowel issues   Shellfish Protein-Containing Drug Products Nausea And Vomiting   Simvastatin -High Dose Other (See Comments)    Muscle aches, flu like symptoms.    "

## 2024-04-12 ENCOUNTER — Ambulatory Visit: Admitting: Podiatry

## 2024-04-21 ENCOUNTER — Ambulatory Visit (HOSPITAL_BASED_OUTPATIENT_CLINIC_OR_DEPARTMENT_OTHER): Admitting: Pulmonary Disease

## 2024-04-25 ENCOUNTER — Ambulatory Visit (HOSPITAL_BASED_OUTPATIENT_CLINIC_OR_DEPARTMENT_OTHER): Admitting: Pulmonary Disease

## 2024-07-05 ENCOUNTER — Ambulatory Visit: Admitting: Podiatry
